# Patient Record
Sex: Male | Born: 1965 | Race: White | Hispanic: No | Marital: Married | Smoking: Never smoker
Health system: Southern US, Community
[De-identification: ages and names within clinical notes are randomized; demographics above are authoritative.]

## PROBLEM LIST (undated history)

## (undated) DIAGNOSIS — M5430 Sciatica, unspecified side: Secondary | ICD-10-CM

## (undated) DIAGNOSIS — H269 Unspecified cataract: Secondary | ICD-10-CM

## (undated) DIAGNOSIS — R0683 Snoring: Secondary | ICD-10-CM

## (undated) DIAGNOSIS — G4730 Sleep apnea, unspecified: Secondary | ICD-10-CM

## (undated) HISTORY — DX: Unspecified cataract: H26.9

---

## 2010-10-14 ENCOUNTER — Ambulatory Visit: Payer: Self-pay | Admitting: Internal Medicine

## 2010-10-26 ENCOUNTER — Ambulatory Visit: Payer: Self-pay | Admitting: Internal Medicine

## 2012-04-20 IMAGING — CR DG RIBS 2V*L*
1 series · 4 of 4 positions shown · non-contrast
Comparison: none

REASON FOR EXAM: left side pain and lump at bottom of ribcage
COMMENTS:

PROCEDURE:     KDR - KDXR RIBS LEFT UNILATERAL  - October 14, 2010  [DATE]
RESULT:     Images of the left ribs demonstrate no bony destruction,
fracture or dislocation.

[Series 1: view not recorded · 0.17mm/px · 4 of 4 slices shown]
[im 1/4]
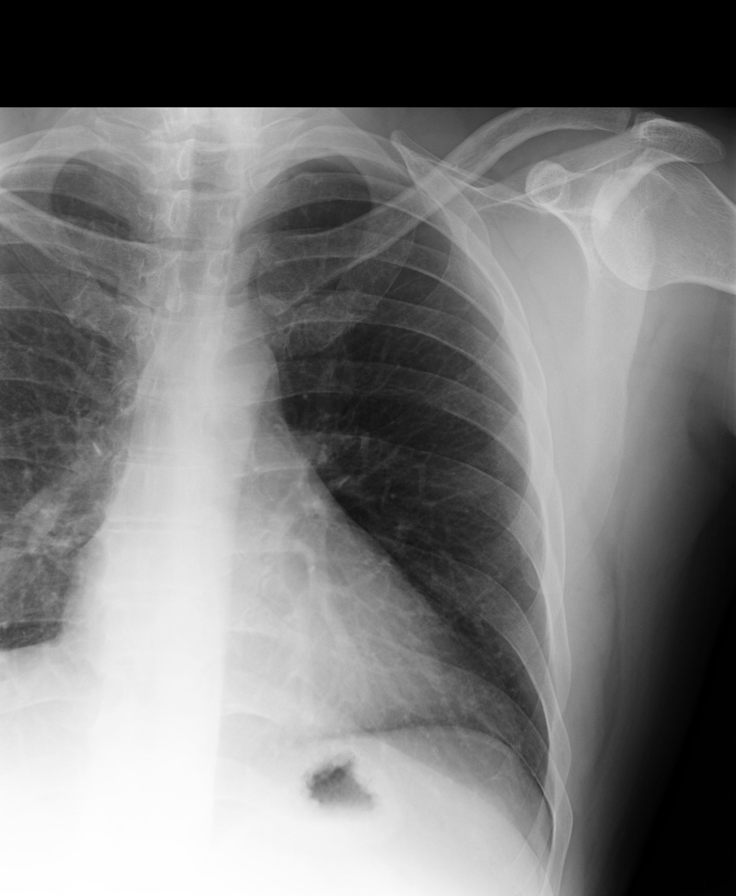
[im 2/4]
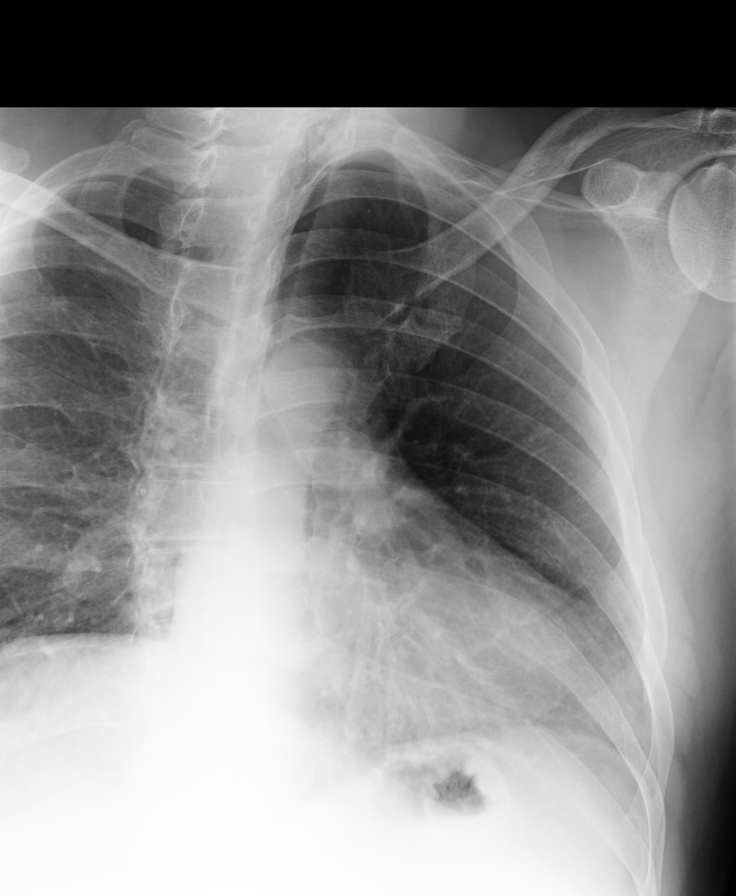
[im 3/4]
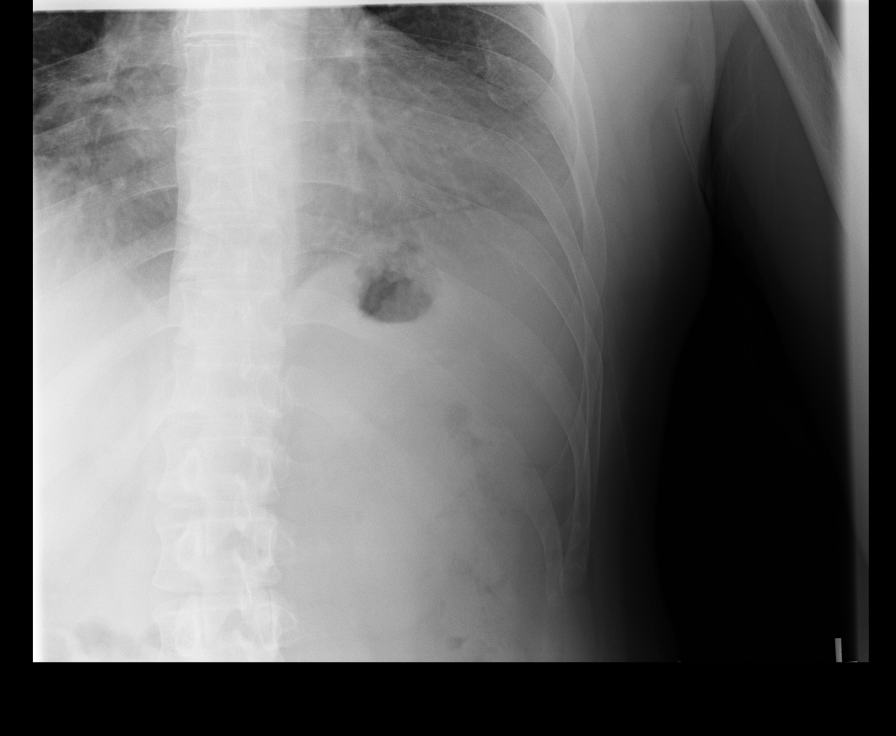
[im 4/4]
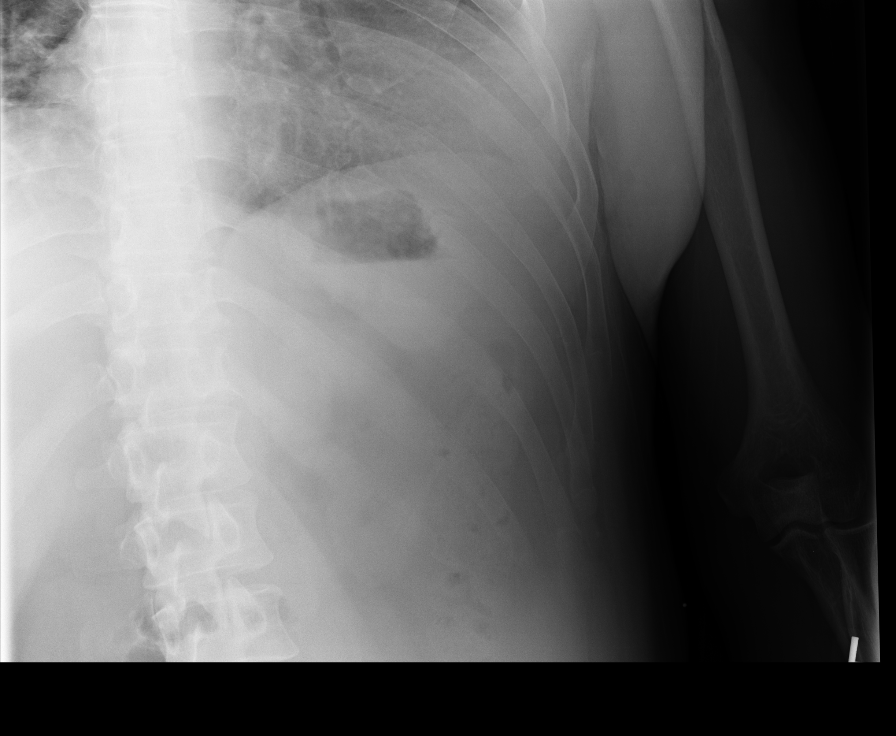

[4 of 4 positions shown; findings below may reference images not displayed]

IMPRESSION: No acute bony abnormality evident.

## 2015-01-05 ENCOUNTER — Encounter: Payer: Self-pay | Admitting: Emergency Medicine

## 2015-01-05 ENCOUNTER — Ambulatory Visit
Admission: EM | Admit: 2015-01-05 | Discharge: 2015-01-05 | Disposition: A | Discharge status: Home / Self Care | Payer: 59 | Attending: Internal Medicine | Admitting: Internal Medicine

## 2015-01-05 DIAGNOSIS — H66001 Acute suppurative otitis media without spontaneous rupture of ear drum, right ear: Secondary | ICD-10-CM

## 2015-01-05 MED ORDER — CEFDINIR 300 MG PO CAPS: 300.0000 mg | ORAL_CAPSULE | Freq: Two times a day (BID) | ORAL | Status: AC

## 2015-01-05 NOTE — ED Provider Notes (Addendum)
CSN: [id number]     Arrival date & time 01/05/15  1100 History   First MD Initiated Contact with Patient 01/05/15 1252     Chief Complaint  Patient presents with  . Otalgia    Right Ear  . Sore Throat   HPI  Started a couple weeks ago with flu sx's; improving but has had increased sinus drainage/congestion for several days and bad R earache starting yesterday.  R sided sore throat. No fever.  No cough.    History reviewed. No pertinent past medical history. History reviewed. No pertinent past surgical history. History reviewed. No pertinent family history. History  Substance Use Topics  . Smoking status: Never Smoker   . Smokeless tobacco: Not on file  . Alcohol Use: No    Review of Systems  All other systems reviewed and are negative.   Allergies  Review of patient's allergies indicates no known allergies.  Home Medications   Prior to Admission medications   Not on File   BP 124/84 mmHg  Pulse 77  Temp(Src) 97.3 F (36.3 C) (Tympanic)  Resp 16  Ht 6\' 1"  (1.854 m)  Wt 210 lb (95.255 kg)  BMI 27.71 kg/m2  SpO2 98% Physical Exam  Constitutional: He is oriented to person, place, and time. He appears well-developed and well-nourished. No distress.  HENT:  Head: Atraumatic.  Right Ear: Tympanic membrane is erythematous and bulging. A middle ear effusion is present.  Left Ear: Tympanic membrane is not erythematous. A middle ear effusion is present.  Nose: Mucosal edema and rhinorrhea present.  Mouth/Throat: Posterior oropharyngeal edema and posterior oropharyngeal erythema present.  Eyes: Lids are normal. Right conjunctiva is not injected. Left conjunctiva is not injected. Right eye exhibits normal extraocular motion. Left eye exhibits normal extraocular motion.  Neck: Neck supple.  Cardiovascular: Normal rate.   Pulmonary/Chest: Effort normal. No respiratory distress.  Abdominal: He exhibits no distension.  Musculoskeletal: Normal range of motion.  Neurological: He  is alert and oriented to person, place, and time.  Skin: Skin is warm and dry.  Nursing note and vitals reviewed.   ED Course  Procedures (including critical care time) Labs Review Labs Reviewed - No data to display  Imaging Review No results found.   MDM   Right otitis media  Rx for cefdinir.  Recheck if not improving in several days.  Sherlene Shams, MD 01/05/15 2030  Sherlene Shams, MD 01/17/15 [phone]

## 2015-01-05 NOTE — ED Notes (Signed)
Patient c/o R earache and sore throat for about 3 weeks.  Patient denies fevers.

## 2015-01-05 NOTE — Discharge Instructions (Signed)

## 2018-01-23 ENCOUNTER — Other Ambulatory Visit: Payer: Self-pay

## 2018-01-23 DIAGNOSIS — Z1211 Encounter for screening for malignant neoplasm of colon: Secondary | ICD-10-CM

## 2018-01-23 MED ORDER — NA SULFATE-K SULFATE-MG SULF 17.5-3.13-1.6 GM/177ML PO SOLN: 1.0000 | Freq: Once | ORAL | 0 refills | Status: AC

## 2018-02-01 NOTE — Discharge Instructions (Signed)
General Anesthesia, Adult, Care After °These instructions provide you with information about caring for yourself after your procedure. Your health care provider may also give you more specific instructions. Your treatment has been planned according to current medical practices, but problems sometimes occur. Call your health care provider if you have any problems or questions after your procedure. °What can I expect after the procedure? °After the procedure, it is common to have: °· Vomiting. °· A sore throat. °· Mental slowness. ° °It is common to feel: °· Nauseous. °· Cold or shivery. °· Sleepy. °· Tired. °· Sore or achy, even in parts of your body where you did not have surgery. ° °Follow these instructions at home: °For at least 24 hours after the procedure: °· Do not: °? Participate in activities where you could fall or become injured. °? Drive. °? Use heavy machinery. °? Drink alcohol. °? Take sleeping pills or medicines that cause drowsiness. °? Make important decisions or sign legal documents. °? Take care of children on your own. °· Rest. °Eating and drinking °· If you vomit, drink water, juice, or soup when you can drink without vomiting. °· Drink enough fluid to keep your urine clear or pale yellow. °· Make sure you have little or no nausea before eating solid foods. °· Follow the diet recommended by your health care provider. °General instructions °· Have a responsible adult stay with you until you are awake and alert. °· Return to your normal activities as told by your health care provider. Ask your health care provider what activities are safe for you. °· Take over-the-counter and prescription medicines only as told by your health care provider. °· If you smoke, do not smoke without supervision. °· Keep all follow-up visits as told by your health care provider. This is important. °Contact a health care provider if: °· You continue to have nausea or vomiting at home, and medicines are not helpful. °· You  cannot drink fluids or start eating again. °· You cannot urinate after 8-12 hours. °· You develop a skin rash. °· You have fever. °· You have increasing redness at the site of your procedure. °Get help right away if: °· You have difficulty breathing. °· You have chest pain. °· You have unexpected bleeding. °· You feel that you are having a life-threatening or urgent problem. °This information is not intended to replace advice given to you by your health care provider. Make sure you discuss any questions you have with your health care provider. °Document Released: 11/29/2000 Document Revised: 01/26/2016 Document Reviewed: 08/07/2015 °Elsevier Interactive Patient Education © 2018 [employer] ° °

## 2018-02-02 ENCOUNTER — Encounter
Admission: RE | Disposition: A | Discharge status: Home / Self Care | Payer: Self-pay | Source: Ambulatory Visit | Attending: Gastroenterology

## 2018-02-02 ENCOUNTER — Ambulatory Visit: Payer: BLUE CROSS/BLUE SHIELD | Admitting: Anesthesiology

## 2018-02-02 ENCOUNTER — Ambulatory Visit
Admission: RE | Admit: 2018-02-02 | Discharge: 2018-02-02 | Disposition: A | Discharge status: Home / Self Care | Payer: BLUE CROSS/BLUE SHIELD | Source: Ambulatory Visit | Attending: Gastroenterology | Admitting: Gastroenterology

## 2018-02-02 DIAGNOSIS — K635 Polyp of colon: Secondary | ICD-10-CM | POA: Diagnosis not present

## 2018-02-02 DIAGNOSIS — K640 First degree hemorrhoids: Secondary | ICD-10-CM | POA: Diagnosis not present

## 2018-02-02 DIAGNOSIS — D123 Benign neoplasm of transverse colon: Secondary | ICD-10-CM

## 2018-02-02 DIAGNOSIS — D125 Benign neoplasm of sigmoid colon: Secondary | ICD-10-CM

## 2018-02-02 DIAGNOSIS — Z1211 Encounter for screening for malignant neoplasm of colon: Secondary | ICD-10-CM | POA: Diagnosis not present

## 2018-02-02 HISTORY — DX: Sciatica, unspecified side: M54.30

## 2018-02-02 HISTORY — PX: COLONOSCOPY WITH PROPOFOL: SHX5780

## 2018-02-02 HISTORY — PX: POLYPECTOMY: SHX5525

## 2018-02-02 HISTORY — DX: Snoring: R06.83

## 2018-02-02 SURGERY — COLONOSCOPY WITH PROPOFOL
Anesthesia: General

## 2018-02-02 MED ORDER — ACETAMINOPHEN 325 MG PO TABS: 650.0000 mg | ORAL_TABLET | Freq: Once | ORAL | Status: DC | PRN

## 2018-02-02 MED ORDER — LIDOCAINE HCL (CARDIAC) PF 100 MG/5ML IV SOSY
PREFILLED_SYRINGE | INTRAVENOUS | Status: DC | PRN
  Administered 2018-02-02: 20 mg via INTRAVENOUS

## 2018-02-02 MED ORDER — ONDANSETRON HCL 4 MG/2ML IJ SOLN: 4.0000 mg | Freq: Once | INTRAMUSCULAR | Status: DC | PRN

## 2018-02-02 MED ORDER — ACETAMINOPHEN 160 MG/5ML PO SOLN: 325.0000 mg | ORAL | Status: DC | PRN

## 2018-02-02 MED ORDER — STERILE WATER FOR IRRIGATION IR SOLN
Status: DC | PRN
  Administered 2018-02-02: 10:00:00

## 2018-02-02 MED ORDER — LACTATED RINGERS IV SOLN
INTRAVENOUS | Status: DC
  Administered 2018-02-02 (×2): via INTRAVENOUS

## 2018-02-02 MED ORDER — PROPOFOL 10 MG/ML IV BOLUS
INTRAVENOUS | Status: DC | PRN
  Administered 2018-02-02: 50 mg via INTRAVENOUS
  Administered 2018-02-02: 80 mg via INTRAVENOUS
  Administered 2018-02-02 (×2): 60 mg via INTRAVENOUS
  Administered 2018-02-02: 20 mg via INTRAVENOUS
  Administered 2018-02-02: 50 mg via INTRAVENOUS
  Administered 2018-02-02: 30 mg via INTRAVENOUS

## 2018-02-02 MED ORDER — SODIUM CHLORIDE 0.9 % IV SOLN: INTRAVENOUS | Status: DC

## 2018-02-02 SURGICAL SUPPLY — 24 items
CANISTER SUCT 1200ML W/VALVE (MISCELLANEOUS) ×2 IMPLANT
CLIP HMST 235XBRD CATH ROT (MISCELLANEOUS) IMPLANT
CLIP RESOLUTION 360 11X235 (MISCELLANEOUS)
ELECT REM PT RETURN 9FT ADLT (ELECTROSURGICAL)
ELECTRODE REM PT RTRN 9FT ADLT (ELECTROSURGICAL) IMPLANT
FCP ESCP3.2XJMB 240X2.8X (MISCELLANEOUS)
FORCEPS BIOP RAD 4 LRG CAP 4 (CUTTING FORCEPS) ×2 IMPLANT
FORCEPS BIOP RJ4 240 W/NDL (MISCELLANEOUS)
FORCEPS ESCP3.2XJMB 240X2.8X (MISCELLANEOUS) IMPLANT
GOWN CVR UNV OPN BCK APRN NK (MISCELLANEOUS) ×2 IMPLANT
GOWN ISOL THUMB LOOP REG UNIV (MISCELLANEOUS) ×2
INJECTOR VARIJECT VIN23 (MISCELLANEOUS) IMPLANT
KIT DEFENDO VALVE AND CONN (KITS) IMPLANT
KIT ENDO PROCEDURE OLY (KITS) ×2 IMPLANT
MARKER SPOT ENDO TATTOO 5ML (MISCELLANEOUS) IMPLANT
PROBE APC STR FIRE (PROBE) IMPLANT
RETRIEVER NET ROTH 2.5X230 LF (MISCELLANEOUS) IMPLANT
SNARE SHORT THROW 13M SML OVAL (MISCELLANEOUS) IMPLANT
SNARE SHORT THROW 30M LRG OVAL (MISCELLANEOUS) IMPLANT
SNARE SNG USE RND 15MM (INSTRUMENTS) IMPLANT
SPOT EX ENDOSCOPIC TATTOO (MISCELLANEOUS)
TRAP ETRAP POLY (MISCELLANEOUS) IMPLANT
VARIJECT INJECTOR VIN23 (MISCELLANEOUS)
WATER STERILE IRR 250ML POUR (IV SOLUTION) ×2 IMPLANT

## 2018-02-02 NOTE — Op Note (Signed)
[facility] Gastroenterology Patient Name: Kevin Santana Procedure Date: 02/02/2018 9:29 AM MRN: [id number] Account #: [id number] Date of Birth: 13-Feb-1966 Admit Type: Outpatient Age: 52 Room: [facility] OR ROOM 01 Gender: Male Note Status: Finalized Procedure:            Colonoscopy Indications:          Screening for colorectal malignant neoplasm Providers:            Lucilla Lame MD, MD Referring MD:         Lynnell Jude (Referring MD) Medicines:            Propofol per Anesthesia Complications:        No immediate complications. Procedure:            Pre-Anesthesia Assessment:                       - Prior to the procedure, a History and Physical was                        performed, and patient medications and allergies were                        reviewed. The patient's tolerance of previous                        anesthesia was also reviewed. The risks and benefits of                        the procedure and the sedation options and risks were                        discussed with the patient. All questions were                        answered, and informed consent was obtained. Prior                        Anticoagulants: The patient has taken no previous                        anticoagulant or antiplatelet agents. ASA Grade                        Assessment: II - A patient with mild systemic disease.                        After reviewing the risks and benefits, the patient was                        deemed in satisfactory condition to undergo the                        procedure.                       After obtaining informed consent, the colonoscope was                        passed under direct vision. Throughout the procedure,  the patient's blood pressure, pulse, and oxygen                        saturations were monitored continuously. The [place] [id number]) was introduced through the               anus and advanced to the the cecum, identified by                        appendiceal orifice and ileocecal valve. The                        colonoscopy was performed without difficulty. The                        patient tolerated the procedure well. The quality of                        the bowel preparation was excellent. Findings:      The perianal and digital rectal examinations were normal.      A 2 mm polyp was found in the sigmoid colon. The polyp was sessile. The       polyp was removed with a cold biopsy forceps. Resection and retrieval       were complete.      A 4 mm polyp was found in the transverse colon. The polyp was sessile.       The polyp was removed with a cold biopsy forceps. Resection and       retrieval were complete.      Non-bleeding internal hemorrhoids were found during retroflexion. The       hemorrhoids were Grade I (internal hemorrhoids that do not prolapse). Impression:           - One 2 mm polyp in the sigmoid colon, removed with a                        cold biopsy forceps. Resected and retrieved.                       - One 4 mm polyp in the transverse colon, removed with                        a cold biopsy forceps. Resected and retrieved.                       - Non-bleeding internal hemorrhoids. Recommendation:       - Discharge patient to home.                       - Resume previous diet.                       - Continue present medications.                       - Await pathology results.                       - Repeat colonoscopy in 5 years if polyp  adenoma and 10                        years if hyperplastic Procedure Code(s):    --- Professional ---                       [phone], Colonoscopy, flexible; with biopsy, single or                        multiple Diagnosis Code(s):    --- Professional ---                       Z12.11, Encounter for screening for malignant neoplasm                        of colon                       D12.5,  Benign neoplasm of sigmoid colon                       D12.3, Benign neoplasm of transverse colon (hepatic                        flexure or splenic flexure) CPT copyright 2017 [employer]. All rights reserved. The codes documented in this report are preliminary and upon coder review may  be revised to meet current compliance requirements. Lucilla Lame MD, MD 02/02/2018 9:59:43 AM This report has been signed electronically. Number of Addenda: 0 Note Initiated On: 02/02/2018 9:29 AM Scope Withdrawal Time: 0 hours 7 minutes 53 seconds  Total Procedure Duration: 0 hours 10 minutes 9 seconds       [facility]

## 2018-02-02 NOTE — H&P (Signed)
   Lucilla Lame, MD [address] [place], [place] [address] Phone: [phone] Fax : [phone]  Primary Care Physician:  Lynnell Jude, MD Primary Gastroenterologist:  Dr. Allen Norris  Pre-Procedure History & Physical: HPI:  Kevin Santana is a 52 y.o. male is here for a screening colonoscopy.   Past Medical History:  Diagnosis Date  . Sciatica   . Snores     History reviewed. No pertinent surgical history.  Prior to Admission medications   Medication Sig Start Date End Date Taking? Authorizing Provider  Flaxseed, Linseed, (FLAX SEED OIL PO) Take by mouth.   Yes [provider]    Allergies as of 01/23/2018  . (No Known Allergies)    History reviewed. No pertinent family history.  Social History   Socioeconomic History  . Marital status: Married    Spouse name: Not on file  . Number of children: Not on file  . Years of education: Not on file  . Highest education level: Not on file  Occupational History  . Not on file  Social Needs  . Financial resource strain: Not on file  . Food insecurity:    Worry: Not on file    Inability: Not on file  . Transportation needs:    Medical: Not on file    Non-medical: Not on file  Tobacco Use  . Smoking status: Never Smoker  . Smokeless tobacco: Never Used  Substance and Sexual Activity  . Alcohol use: No  . Drug use: Not on file  . Sexual activity: Not on file  Lifestyle  . Physical activity:    Days per week: Not on file    Minutes per session: Not on file  . Stress: Not on file  Relationships  . Social connections:    Talks on phone: Not on file    Gets together: Not on file    Attends religious service: Not on file    Active member of club or organization: Not on file    Attends meetings of clubs or organizations: Not on file    Relationship status: Not on file  . Intimate partner violence:    Fear of current or ex partner: Not on file    Emotionally abused: Not on file    Physically  abused: Not on file    Forced sexual activity: Not on file  Other Topics Concern  . Not on file  Social History Narrative  . Not on file    Review of Systems: See HPI, otherwise negative ROS  Physical Exam: BP 130/73   Pulse 73   Resp 16   Ht 6\' 1"  (1.854 m)   Wt 225 lb (102.1 kg)   SpO2 96%   BMI 29.69 kg/m  General:   Alert,  pleasant and cooperative in NAD Head:  Normocephalic and atraumatic. Neck:  Supple; no masses or thyromegaly. Lungs:  Clear throughout to auscultation.    Heart:  Regular rate and rhythm. Abdomen:  Soft, nontender and nondistended. Normal bowel sounds, without guarding, and without rebound.   Neurologic:  Alert and  oriented x4;  grossly normal neurologically.  Impression/Plan: Kevin Santana is now here to undergo a screening colonoscopy.  Risks, benefits, and alternatives regarding colonoscopy have been reviewed with the patient.  Questions have been answered.  All parties agreeable.

## 2018-02-02 NOTE — Anesthesia Postprocedure Evaluation (Signed)
Anesthesia Post Note  Patient: Kevin Santana  Procedure(s) Performed: COLONOSCOPY WITH PROPOFOL (N/A ) POLYPECTOMY (N/A )  Patient location during evaluation: PACU Anesthesia Type: General Level of consciousness: awake and alert, oriented and patient cooperative Pain management: pain level controlled Vital Signs Assessment: post-procedure vital signs reviewed and stable Respiratory status: spontaneous breathing, nonlabored ventilation and respiratory function stable Cardiovascular status: blood pressure returned to baseline and stable Postop Assessment: adequate PO intake Anesthetic complications: no    Darrin Nipper

## 2018-02-02 NOTE — Anesthesia Preprocedure Evaluation (Signed)
Anesthesia Evaluation  Patient identified by MRN, date of birth, ID band Patient awake    Reviewed: Allergy & Precautions, NPO status , Patient's Chart, lab work & pertinent test results  History of Anesthesia Complications Negative for: history of anesthetic complications  Airway Mallampati: III  TM Distance: >3 FB Neck ROM: Full    Dental  (+)    Pulmonary  Snoring    Pulmonary exam normal breath sounds clear to auscultation       Cardiovascular Exercise Tolerance: Good negative cardio ROS Normal cardiovascular exam Rhythm:Regular Rate:Normal     Neuro/Psych negative neurological ROS     GI/Hepatic negative GI ROS,   Endo/Other  negative endocrine ROS  Renal/GU negative Renal ROS     Musculoskeletal   Abdominal   Peds  Hematology negative hematology ROS (+)   Anesthesia Other Findings   Reproductive/Obstetrics                             Anesthesia Physical Anesthesia Plan  ASA: I  Anesthesia Plan: General   Post-op Pain Management:    Induction: Intravenous  PONV Risk Score and Plan: 2 and Propofol infusion and TIVA  Airway Management Planned: Natural Airway  Additional Equipment:   Intra-op Plan:   Post-operative Plan:   Informed Consent: I have reviewed the patients History and Physical, chart, labs and discussed the procedure including the risks, benefits and alternatives for the proposed anesthesia with the patient or authorized representative who has indicated his/her understanding and acceptance.     Plan Discussed with: CRNA  Anesthesia Plan Comments:         Anesthesia Quick Evaluation

## 2018-02-02 NOTE — Transfer of Care (Signed)
Immediate Anesthesia Transfer of Care Note  Patient: Kevin Santana  Procedure(s) Performed: COLONOSCOPY WITH PROPOFOL (N/A )  Patient Location: PACU  Anesthesia Type: General  Level of Consciousness: awake, alert  and patient cooperative  Airway and Oxygen Therapy: Patient Spontanous Breathing and Patient connected to supplemental oxygen  Post-op Assessment: Post-op Vital signs reviewed, Patient's Cardiovascular Status Stable, Respiratory Function Stable, Patent Airway and No signs of Nausea or vomiting  Post-op Vital Signs: Reviewed and stable  Complications: No apparent anesthesia complications

## 2018-02-02 NOTE — Anesthesia Procedure Notes (Signed)
Procedure Name: MAC Date/Time: 02/02/2018 9:50 AM Performed by: Lind Guest, CRNA Pre-anesthesia Checklist: Patient identified, Emergency Drugs available, Suction available, Patient being monitored and Timeout performed Patient Re-evaluated:Patient Re-evaluated prior to induction Oxygen Delivery Method: Nasal cannula

## 2018-02-03 ENCOUNTER — Encounter: Payer: Self-pay | Admitting: Gastroenterology

## 2018-02-06 ENCOUNTER — Encounter: Payer: Self-pay | Admitting: Gastroenterology

## 2019-10-26 NOTE — Progress Notes (Signed)
[facility] Note  10/30/2019     CHIEF COMPLAINT Patient presents for Retina Evaluation   HISTORY OF PRESENT ILLNESS: Kevin Santana is a 54 y.o. male who presents to the clinic today for:   HPI    Retina Evaluation    In left eye.  Onset: unknown.  Duration: unknown.  Associated Symptoms Negative for Flashes, Distortion, Pain, Photophobia, Trauma, Jaw Claudication, Fever, Fatigue, Floaters, Blind Spot, Redness, Glare, Scalp Tenderness, Shoulder/Hip pain and Weight Loss.  Context:  reading.  Treatments tried include no treatments.  I, the attending physician,  performed the HPI with the patient and updated documentation appropriately.          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 Only vision complaint is blurry at times for reading. Uses readers for up-close, no correction for distance. Patient has amblyopia OD.        Last edited by Bernarda Caffey, MD on 10/30/2019  8:43 AM. (History)    pt is here on the referral of Dr. Rick Duff for possible nevus OS, he states that was the first visit with [facility], pt has not previously been told that he has a nevus, pt denies being diabetic of hypertensive, pt states his vision started decreasing about 10 years ago, but he has only been wearing readers since then, pt states his right eye is amblyopic, and he wore a patch when he was 5, pt states his dad has a hx of abdominal cancer  Referring physician: Thelma Comp, [place],  [place] [id number]  HISTORICAL INFORMATION:   Selected notes from the [facility] Referred by Dr. Rick Duff for ret eval   CURRENT MEDICATIONS: No current outpatient medications on file. (Ophthalmic Drugs)   No current facility-administered medications for this visit. (Ophthalmic Drugs)   Current Outpatient Medications (Other)  Medication Sig  . co-enzyme Q-10 50 MG capsule Take 50 mg by mouth daily.  . Flaxseed, Linseed, (FLAX SEED OIL  PO) Take by mouth.   No current facility-administered medications for this visit. (Other)      REVIEW OF SYSTEMS: ROS    Positive for: Eyes   Negative for: Constitutional, Gastrointestinal, Neurological, Skin, Genitourinary, Musculoskeletal, HENT, Endocrine, Cardiovascular, Respiratory, Psychiatric, Allergic/Imm, Heme/Lymph   Last edited by Jobe Marker, COT on 10/30/2019  8:15 AM. (History)       ALLERGIES No Known Allergies  PAST MEDICAL HISTORY Past Medical History:  Diagnosis Date  . Sciatica   . Snores    Past Surgical History:  Procedure Laterality Date  . COLONOSCOPY WITH PROPOFOL N/A 02/02/2018   Procedure: COLONOSCOPY WITH PROPOFOL;  Surgeon: Lucilla Lame, MD;  Location: [facility];  Service: Endoscopy;  Laterality: N/A;  . POLYPECTOMY N/A 02/02/2018   Procedure: POLYPECTOMY;  Surgeon: Lucilla Lame, MD;  Location: [facility];  Service: Endoscopy;  Laterality: N/A;    FAMILY HISTORY History reviewed. No pertinent family history.  SOCIAL HISTORY Social History   Tobacco Use  . Smoking status: Never Smoker  . Smokeless tobacco: Never Used  Substance Use Topics  . Alcohol use: No  . Drug use: Not on file         OPHTHALMIC EXAM:  Base Eye Exam    Visual Acuity (Snellen - Linear)      Right Left   Dist [place] 20/70 -3 20/20 -2   Dist ph cc NI 20/20       Tonometry (Tonopen, 8:26 AM)  Right Left   Pressure 21 24       Pupils      Dark Light Shape React APD   Right 4 3 Round Slow None   Left 4 3 Round Slow None       Visual Fields (Counting fingers)      Left Right    Full Full       Extraocular Movement      Right Left    Full, Ortho Full, Ortho       Dilation    Both eyes: 1.0% Mydriacyl, 2.5% Phenylephrine @ 8:26 AM        Slit Lamp and Fundus Exam    Slit Lamp Exam      Right Left   Lids/Lashes Meibomian gland dysfunction, Scurf Meibomian gland dysfunction, Scurf   Conjunctiva/Sclera White and quiet White  and quiet   Cornea Trace Punctate epithelial erosions, +EBMD Trace Punctate epithelial erosions, +EBMD   Anterior Chamber Deep and quiet Deep and quiet   Iris Round and dilated Round and dilated   Lens 1+ Nuclear sclerosis, 1-2+ Cortical cataract 2+ Nuclear sclerosis, 2+ Cortical cataract   Vitreous Mild Vitreous syneresis Mild Vitreous syneresis       Fundus Exam      Right Left   Disc Pink and Sharp Pink and Sharp   C/D Ratio 0.0 0.1   Macula Flat, Good foveal reflex, No heme or edema Flat, Good foveal reflex, No heme or edema   Vessels Mild Vascular attenuation, mild Tortuousity Mild Vascular attenuation, mild Tortuousity   Periphery Attached, no heme    Attached, pigmented choroidal mass spanning 0600-0700 equator, approx 3-4DD in diameter with +drusen, no SRF or orange pigment, ~40m in thickness          IMAGING AND PROCEDURES  Imaging and Procedures for @TODAY @  OCT, Retina - OU - Both Eyes       Right Eye Quality was good. Central Foveal Thickness: 349. Progression has no prior data. Findings include normal foveal contour, no IRF, no SRF.   Left Eye Quality was good. Central Foveal Thickness: 345. Progression has no prior data. Findings include normal foveal contour, no IRF, no SRF (Elevated choroidal lesion inferior equator w/ mild surrounding schisis; no SRF -- caught on widefield).   Notes *Images captured and stored on drive  Diagnosis / Impression:  NFP, no IRF/SRF OU Elevated choroidal lesion inferior equator w/ mild surrounding schisis; no SRF -- caught on widefield  Clinical management:  See below  Abbreviations: NFP - Normal foveal profile. CME - cystoid macular edema. PED - pigment epithelial detachment. IRF - intraretinal fluid. SRF - subretinal fluid. EZ - ellipsoid zone. ERM - epiretinal membrane. ORA - outer retinal atrophy. ORT - outer retinal tubulation. SRHM - subretinal hyper-reflective material        B-Scan Ultrasound - OS - Left Eye        Quality was good. Findings included mass.   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 Retinal edema  H35.81 OCT, Retina - OU - Both Eyes  3. Combined forms of age-related cataract of both eyes  H25.813    1,2. Choroidal Nevus OS  - pigmented choroidal mass  spanning 6-7 oclock equator, no SRF or orange pigment  - OCT shows thickness ~[phone] microns; no SRF, but there is surrounding retinoschisis  - b-scan shows mild elevation of inferior choroidal lesion  - baseline Optos pictures obtained today (02.23.21)  - discussed findings, prognosis and possible treatments  - will refer to [facility] (Dr. Blanchie Serve) for further evaluation  - f/u here in 3-4 months -- okay to cancel if Dr. Cordelia Pen wishes to follow  3. Mixed form age related cataracts OU  - The symptoms of cataract, surgical options, and treatments and risks were discussed with patient.  - discussed diagnosis and progression  - not yet visually significant  - monitor for now   Ophthalmic Meds Ordered this visit:  No orders of the defined types were placed in this encounter.      Return for f/u 3-4 months, nevus OS, DFE, OCT, OPTOS.  There are no Patient Instructions on file for this visit.   Explained the diagnoses, plan, and follow up with the patient and they expressed understanding.  Patient expressed understanding of the importance of proper follow up care.   This document serves as a record of services personally performed by Gardiner Sleeper, MD, PhD. It was created on their behalf by Estill Bakes, COT an ophthalmic technician. The creation of this record is the provider's dictation and/or activities during the visit.    Electronically signed by: Estill Bakes, COT 10/26/19 @ 1:09 PM   This document  serves as a record of services personally performed by Gardiner Sleeper, MD, PhD. It was created on their behalf by Ernest Mallick, OA, an ophthalmic assistant. The creation of this record is the provider's dictation and/or activities during the visit.    Electronically signed by: Ernest Mallick, OA 02.23.2021 1:09 PM  Gardiner Sleeper, M.D., Ph.D. Diseases & Surgery of the Retina and [place] 10/30/2019   I have reviewed the above documentation for accuracy and completeness, and I agree with the above. Gardiner Sleeper, M.D., Ph.D. 10/30/19 1:09 PM   Abbreviations: M myopia (nearsighted); A astigmatism; H hyperopia (farsighted); P presbyopia; Mrx spectacle prescription;  CTL contact lenses; OD right eye; OS left eye; OU both eyes  XT exotropia; ET esotropia; PEK punctate epithelial keratitis; PEE punctate epithelial erosions; DES dry eye syndrome; MGD meibomian gland dysfunction; ATs artificial tears; PFAT's preservative free artificial tears; [place] nuclear sclerotic cataract; PSC posterior subcapsular cataract; ERM epi-retinal membrane; PVD posterior vitreous detachment; RD retinal detachment; DM diabetes mellitus; DR diabetic retinopathy; NPDR non-proliferative diabetic retinopathy; PDR proliferative diabetic retinopathy; CSME clinically significant macular edema; DME diabetic macular edema; dbh dot blot hemorrhages; CWS cotton wool spot; POAG primary open angle glaucoma; C/D cup-to-disc ratio; HVF humphrey visual field; GVF goldmann visual field; OCT optical coherence tomography; IOP intraocular pressure; BRVO Branch retinal vein occlusion; CRVO central retinal vein occlusion; CRAO central retinal artery occlusion; BRAO branch retinal artery occlusion; RT retinal tear; SB scleral buckle; PPV pars plana vitrectomy; VH Vitreous hemorrhage; PRP panretinal laser photocoagulation; IVK intravitreal kenalog; VMT vitreomacular traction; MH Macular hole;  NVD neovascularization of  the disc; NVE neovascularization elsewhere; AREDS age related eye disease study; ARMD age related macular degeneration; POAG primary open angle glaucoma; EBMD epithelial/anterior basement membrane dystrophy; ACIOL anterior chamber intraocular lens; IOL intraocular lens; PCIOL posterior chamber intraocular lens; Phaco/IOL phacoemulsification with intraocular lens placement; [place] photorefractive keratectomy; LASIK laser assisted in situ keratomileusis; HTN hypertension; DM diabetes mellitus; COPD chronic obstructive pulmonary disease

## 2019-10-30 ENCOUNTER — Ambulatory Visit (INDEPENDENT_AMBULATORY_CARE_PROVIDER_SITE_OTHER): Payer: BC Managed Care – PPO | Admitting: Ophthalmology

## 2019-10-30 ENCOUNTER — Encounter (INDEPENDENT_AMBULATORY_CARE_PROVIDER_SITE_OTHER): Payer: Self-pay | Admitting: Ophthalmology

## 2019-10-30 DIAGNOSIS — H3581 Retinal edema: Secondary | ICD-10-CM | POA: Diagnosis not present

## 2019-10-30 DIAGNOSIS — H25813 Combined forms of age-related cataract, bilateral: Secondary | ICD-10-CM | POA: Diagnosis not present

## 2019-10-30 DIAGNOSIS — D3132 Benign neoplasm of left choroid: Secondary | ICD-10-CM

## 2019-11-08 DIAGNOSIS — D3132 Benign neoplasm of left choroid: Secondary | ICD-10-CM | POA: Insufficient documentation

## 2019-11-08 DIAGNOSIS — H2513 Age-related nuclear cataract, bilateral: Secondary | ICD-10-CM | POA: Insufficient documentation

## 2019-11-09 DIAGNOSIS — H53001 Unspecified amblyopia, right eye: Secondary | ICD-10-CM | POA: Insufficient documentation

## 2020-02-21 NOTE — Progress Notes (Signed)
[facility] Note  02/27/2020     CHIEF COMPLAINT Patient presents for Retina Follow Up   HISTORY OF PRESENT ILLNESS: Kevin Santana is a 54 y.o. male who presents to the clinic today for:   HPI    Retina Follow Up    Patient presents with  Other.  In left eye.  This started 4 months ago.  Severity is moderate.  I, the attending physician,  performed the HPI with the patient and updated documentation appropriately.          Comments    Patient here for 4 months retina follow up for Chorodial nevus OS. Patient states vision about the same. No eye pain. 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 Added eye supplement called visiclear.        Last edited by Bernarda Caffey, MD on 02/27/2020  8:47 AM. (History)    pt saw Dr. Cordelia Pen on 03.05.21 who recommended annual follow up here, he was told to return to Dr. Cordelia Pen if he had any significant changes, Dr. Cordelia Pen agreed with Dr. Dairl Ponder dx, pt was in the woods and got hit in the eye with a branch, no vision changes  Referring physician: Lynnell Jude, MD [place],  [place] [phone]  HISTORICAL INFORMATION:   Selected notes from the MEDICAL RECORD NUMBER Referred by Dr. Rick Duff for ret eval   CURRENT MEDICATIONS: No current outpatient medications on file. (Ophthalmic Drugs)   No current facility-administered medications for this visit. (Ophthalmic Drugs)   Current Outpatient Medications (Other)  Medication Sig  . co-enzyme Q-10 50 MG capsule Take 50 mg by mouth daily.  . Flaxseed, Linseed, (FLAX SEED OIL PO) Take by mouth.   No current facility-administered medications for this visit. (Other)      REVIEW OF SYSTEMS: ROS    Positive for: Eyes   Negative for: Constitutional, Gastrointestinal, Neurological, Skin, Genitourinary, Musculoskeletal, HENT, Endocrine, Cardiovascular, Respiratory, Psychiatric, Allergic/Imm, Heme/Lymph   Last edited by Theodore Demark, COA on 02/27/2020   8:16 AM. (History)       ALLERGIES No Known Allergies  PAST MEDICAL HISTORY Past Medical History:  Diagnosis Date  . Sciatica   . Snores    Past Surgical History:  Procedure Laterality Date  . COLONOSCOPY WITH PROPOFOL N/A 02/02/2018   Procedure: COLONOSCOPY WITH PROPOFOL;  Surgeon: Lucilla Lame, MD;  Location: [facility];  Service: Endoscopy;  Laterality: N/A;  . POLYPECTOMY N/A 02/02/2018   Procedure: POLYPECTOMY;  Surgeon: Lucilla Lame, MD;  Location: [facility];  Service: Endoscopy;  Laterality: N/A;    FAMILY HISTORY History reviewed. No pertinent family history.  SOCIAL HISTORY Social History   Tobacco Use  . Smoking status: Never Smoker  . Smokeless tobacco: Never Used  Substance Use Topics  . Alcohol use: No  . Drug use: Not on file         OPHTHALMIC EXAM:  Base Eye Exam    Visual Acuity (Snellen - Linear)      Right Left   Dist [place] 20/80 -1 20/25 +1   Dist ph [place] 20/80 +2 20/20 -1       Tonometry (Tonopen, 8:13 AM)      Right Left   Pressure 18 20       Pupils      Dark Light Shape React APD   Right 4 3 Round Slow None   Left 4 3 Round Slow None  Visual Fields (Counting fingers)      Left Right    Full Full       Extraocular Movement      Right Left    Full, Ortho Full, Ortho       Neuro/Psych    Oriented x3: Yes   Mood/Affect: Normal       Dilation    Both eyes: 1.0% Mydriacyl, 2.5% Phenylephrine @ 8:13 AM        Slit Lamp and Fundus Exam    Slit Lamp Exam      Right Left   Lids/Lashes Meibomian gland dysfunction, Scurf Meibomian gland dysfunction, Scurf   Conjunctiva/Sclera Subconjunctival hemorrhage nasally and inferiorly White and quiet   Cornea Trace Punctate epithelial erosions, +EBMD Trace Punctate epithelial erosions, +EBMD   Anterior Chamber Deep and quiet Deep and quiet   Iris Round and dilated Round and dilated   Lens 1+ Nuclear sclerosis, 1-2+ Cortical cataract 2+ Nuclear sclerosis, 2+  Cortical cataract   Vitreous Mild Vitreous syneresis Mild Vitreous syneresis       Fundus Exam      Right Left   Disc Pink and Sharp, Compact Pink and Sharp, Compact   C/D Ratio 0.0 0.0   Macula Flat, Good foveal reflex, No heme or edema Flat, Good foveal reflex, mild RPE mottling, No heme or edema   Vessels Mild Vascular attenuation, mild Tortuousity Mild Vascular attenuation, mild Tortuousity   Periphery Attached, no heme    Attached, pigmented choroidal mass spanning 0600-0700 equator, approx 3-4DD in diameter (87m) with +drusen, no SRF or orange pigment, ~153min thickness          IMAGING AND PROCEDURES  Imaging and Procedures for @TODAY @  OCT, Retina - OU - Both Eyes       Right Eye Quality was good. Central Foveal Thickness: 349. Progression has been stable. Findings include normal foveal contour, no IRF, no SRF, vitreomacular adhesion  (Single druse).   Left Eye Quality was good. Central Foveal Thickness: 350. Progression has been stable. Findings include normal foveal contour, no IRF, no SRF (Elevated choroidal lesion inferior equator w/ mild surrounding schisis; no SRF -- caught on widefield - stable from prior).   Notes *Images captured and stored on drive  Diagnosis / Impression:  NFP, no IRF/SRF OU OS: Elevated choroidal lesion inferior equator w/ mild surrounding schisis; no SRF -- caught on widefield  Clinical management:  See below  Abbreviations: NFP - Normal foveal profile. CME - cystoid macular edema. PED - pigment epithelial detachment. IRF - intraretinal fluid. SRF - subretinal fluid. EZ - ellipsoid zone. ERM - epiretinal membrane. ORA - outer retinal atrophy. ORT - outer retinal tubulation. 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 Retinal edema  H35.81 OCT, Retina - OU - Both Eyes  3. Combined forms of age-related cataract of both eyes  H25.813     1,2. Choroidal Nevus OS  - pigmented choroidal mass spanning 6-7 oclock equator, no SRF or orange pigment  - OCT shows thickness ~[phone] microns; no SRF, but there is surrounding retinoschisis  - b-scan shows mild elevation of inferior choroidal lesion  - baseline Optos pictures obtained 02.23.21  - saw Dr. C.Blanchie Serven 3.5.21 who agreed with diagnosis and recommended annual f/u here, [facility]  -  f/u here in 1 year, DFE, OCT, optos colors  3. Mixed form age related cataracts OU  - The symptoms of cataract, surgical options, and treatments and risks were discussed with patient.  - discussed diagnosis and progression  - not yet visually significant  - monitor for now   Ophthalmic Meds Ordered this visit:  No orders of the defined types were placed in this encounter.      Return in about 1 year (around 02/26/2021) for f/u nevus OS, DFE, OCT, OPTOS.  There are no Patient Instructions on file for this visit.   Explained the diagnoses, plan, and follow up with the patient and they expressed understanding.  Patient expressed understanding of the importance of proper follow up care.   This document serves as a record of services personally performed by Gardiner Sleeper, MD, PhD. It was created on their behalf by [place] Jetty. Owens Shark, COT, an ophthalmic technician. The creation of this record is the provider's dictation and/or activities during the visit.    Electronically signed by: San Jetty. Owens Shark, [place] 06.23.2021 8:49 AM  Gardiner Sleeper, M.D., Ph.D. Diseases & Surgery of the Retina and [place] 02/27/2020   I have reviewed the above documentation for accuracy and completeness, and I agree with the above. Gardiner Sleeper, M.D., Ph.D. 02/27/20 8:49 AM    Abbreviations: M myopia (nearsighted); A astigmatism; H hyperopia (farsighted); P presbyopia; Mrx spectacle prescription;  CTL contact lenses; OD right eye; OS left eye; OU  both eyes  XT exotropia; ET esotropia; PEK punctate epithelial keratitis; PEE punctate epithelial erosions; DES dry eye syndrome; MGD meibomian gland dysfunction; ATs artificial tears; PFAT's preservative free artificial tears; [place] nuclear sclerotic cataract; PSC posterior subcapsular cataract; ERM epi-retinal membrane; PVD posterior vitreous detachment; RD retinal detachment; DM diabetes mellitus; DR diabetic retinopathy; NPDR non-proliferative diabetic retinopathy; PDR proliferative diabetic retinopathy; CSME clinically significant macular edema; DME diabetic macular edema; dbh dot blot hemorrhages; CWS cotton wool spot; POAG primary open angle glaucoma; C/D cup-to-disc ratio; HVF humphrey visual field; GVF goldmann visual field; OCT optical coherence tomography; IOP intraocular pressure; BRVO Branch retinal vein occlusion; CRVO central retinal vein occlusion; CRAO central retinal artery occlusion; BRAO branch retinal artery occlusion; RT retinal tear; SB scleral buckle; PPV pars plana vitrectomy; VH Vitreous hemorrhage; PRP panretinal laser photocoagulation; IVK intravitreal kenalog; VMT vitreomacular traction; MH Macular hole;  NVD neovascularization of the disc; NVE neovascularization elsewhere; AREDS age related eye disease study; ARMD age related macular degeneration; POAG primary open angle glaucoma; EBMD epithelial/anterior basement membrane dystrophy; ACIOL anterior chamber intraocular lens; IOL intraocular lens; PCIOL posterior chamber intraocular lens; Phaco/IOL phacoemulsification with intraocular lens placement; [place] photorefractive keratectomy; LASIK laser assisted in situ keratomileusis; HTN hypertension; DM diabetes mellitus; COPD chronic obstructive pulmonary disease

## 2020-02-27 ENCOUNTER — Other Ambulatory Visit: Payer: Self-pay

## 2020-02-27 ENCOUNTER — Encounter (INDEPENDENT_AMBULATORY_CARE_PROVIDER_SITE_OTHER): Payer: Self-pay | Admitting: Ophthalmology

## 2020-02-27 ENCOUNTER — Ambulatory Visit (INDEPENDENT_AMBULATORY_CARE_PROVIDER_SITE_OTHER): Payer: BC Managed Care – PPO | Admitting: Ophthalmology

## 2020-02-27 DIAGNOSIS — H3581 Retinal edema: Secondary | ICD-10-CM | POA: Diagnosis not present

## 2020-02-27 DIAGNOSIS — H25813 Combined forms of age-related cataract, bilateral: Secondary | ICD-10-CM | POA: Diagnosis not present

## 2020-02-27 DIAGNOSIS — D3132 Benign neoplasm of left choroid: Secondary | ICD-10-CM | POA: Diagnosis not present

## 2021-02-24 NOTE — Progress Notes (Signed)
[facility] Note  02/26/2021     CHIEF COMPLAINT Patient presents for Retina Follow Up   HISTORY OF PRESENT ILLNESS: Kevin Santana is a 55 y.o. male who presents to the clinic today for:   HPI     Retina Follow Up   Patient presents with  Other.  In left eye.  This started 16 months ago.  Severity is mild.  Duration of 1 year.  Since onset it is stable.  I, the attending physician,  performed the HPI with the patient and updated documentation appropriately.        Comments   55 y/o male pt here for 1 yr f/u for chor nevus OS.  No change in [place] OU.  Denies pain, FOL, floaters.  No gtts.      Last edited by Kevin Caffey, MD on 02/26/2021  8:56 AM.     pt states vision is stable  Referring physician: Thelma Comp, [address],  [place] [id number]  HISTORICAL INFORMATION:   Selected notes from the MEDICAL RECORD NUMBER Referred by Dr. Rick Duff for ret eval   CURRENT MEDICATIONS: No current outpatient medications on file. (Ophthalmic Drugs)   No current facility-administered medications for this visit. (Ophthalmic Drugs)   Current Outpatient Medications (Other)  Medication Sig   co-enzyme Q-10 50 MG capsule Take 50 mg by mouth daily.   Flaxseed, Linseed, (FLAX SEED OIL PO) Take by mouth.   No current facility-administered medications for this visit. (Other)      REVIEW OF SYSTEMS: ROS   Positive for: Eyes Negative for: Constitutional, Gastrointestinal, Neurological, Skin, Genitourinary, Musculoskeletal, HENT, Endocrine, Cardiovascular, Respiratory, Psychiatric, Allergic/Imm, Heme/Lymph Last edited by Kevin Santana, COA on 02/26/2021  8:10 AM.        ALLERGIES No Known Allergies  PAST MEDICAL HISTORY Past Medical History:  Diagnosis Date   Cataract    Sciatica    Snores    Past Surgical History:  Procedure Laterality Date   COLONOSCOPY WITH PROPOFOL N/A 02/02/2018   Procedure: COLONOSCOPY WITH  PROPOFOL;  Surgeon: Lucilla Lame, MD;  Location: [place] [facility];  Service: Endoscopy;  Laterality: N/A;   POLYPECTOMY N/A 02/02/2018   Procedure: POLYPECTOMY;  Surgeon: Lucilla Lame, MD;  Location: [facility];  Service: Endoscopy;  Laterality: N/A;    FAMILY HISTORY History reviewed. No pertinent family history.  SOCIAL HISTORY Social History   Tobacco Use   Smoking status: Never   Smokeless tobacco: Never  Substance Use Topics   Alcohol use: No         OPHTHALMIC EXAM:  Base Eye Exam     Visual Acuity (Snellen - Linear)       Right Left   Dist [place] 20/80 20/20 -2   Dist ph [place] 20/70 +2          Tonometry (Tonopen, 8:11 AM)       Right Left   Pressure 22 18         Pupils       Dark Light Shape React APD   Right 4 3 Round Slow None   Left 4 3 Round Slow None         Visual Fields (Counting fingers)       Left Right    Full Full         Extraocular Movement       Right Left    Full, Ortho Full, Ortho  Neuro/Psych     Oriented x3: Yes   Mood/Affect: Normal         Dilation     Both eyes: 1.0% Mydriacyl, 2.5% Phenylephrine @ 8:11 AM           Slit Lamp and Fundus Exam     Slit Lamp Exam       Right Left   Lids/Lashes Meibomian gland dysfunction, Scurf Meibomian gland dysfunction, Scurf   Conjunctiva/Sclera Subconjunctival hemorrhage nasally and inferiorly White and quiet   Cornea tear film debris tear film debris   Anterior Chamber Deep and quiet Deep and quiet   Iris Round and dilated Round and dilated   Lens 1+ Nuclear sclerosis, 1-2+ Cortical cataract 2+ Nuclear sclerosis, 2+ Cortical cataract   Vitreous Mild Vitreous syneresis Mild Vitreous syneresis         Fundus Exam       Right Left   Disc mild pallor, Sharp, Compact Pink and Sharp, Compact   C/D Ratio 0.0 0.0   Macula Flat, Good foveal reflex, RPE mottling and clumping, No heme or edema Flat, Good foveal reflex, mild RPE mottling, No heme  or edema   Vessels attenuated, mild tortuousity, mild AV crossing changes attenuated, mild tortuousity, mild AV crossing changes   Periphery Attached, no heme    Attached, pigmented choroidal mass spanning 0600-0700 equator, approx 3-4DD in diameter (35mm) with +drusen, no SRF or orange pigment, ~64mm in thickness -- no significant change from prior, mild White without pressure temporally            IMAGING AND PROCEDURES  Imaging and Procedures for $RemoveBefore'@TODAY'cNHThkVxsSqSx$ @  OCT, Retina - OU - Both Eyes       Right Eye Quality was good. Central Foveal Thickness: 350. Progression has been stable. Findings include normal foveal contour, no IRF, no SRF, vitreomacular adhesion (Single druse).   Left Eye Quality was good. Central Foveal Thickness: 350. Progression has been stable. Findings include normal foveal contour, no IRF, no SRF, vitreomacular adhesion (Elevated choroidal lesion inferior equator w/ overlying drusen and surrounding schisis; no SRF -- caught on widefield - stable from prior).   Notes *Images captured and stored on drive  Diagnosis / Impression:  NFP, no IRF/SRF OU OS: Elevated choroidal lesion inferior equator w/ drusen and surrounding schisis; no SRF -- caught on widefield -- stable from prior  Clinical management:  See below  Abbreviations: NFP - Normal foveal profile. CME - cystoid macular edema. PED - pigment epithelial detachment. IRF - intraretinal fluid. SRF - subretinal fluid. EZ - ellipsoid zone. ERM - epiretinal membrane. ORA - outer retinal atrophy. ORT - outer retinal tubulation. SRHM - subretinal hyper-reflective material      Color Fundus Photography Optos - OU - Both Eyes       Right Eye Progression has been stable. Disc findings include normal observations. Macula : normal observations. Vessels : normal observations. Periphery : normal observations.   Left Eye Progression has been stable. Disc findings include normal observations. Macula : normal observations.  Vessels : normal observations. Periphery : mass, RPE abnormality.   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 Retinal edema  H35.81 OCT,  Retina - OU - Both Eyes    3. Combined forms of age-related cataract of both eyes  H25.813     4. Amblyopia of right eye  H53.001       1,2. Choroidal Nevus OS  - pigmented choroidal mass spanning 6-7 oclock equator, no SRF or orange pigment  - OCT shows thickness ~[id number] microns; no SRF, but there is surrounding retinoschisis -- no change from prior  - b-scan shows mild elevation of inferior choroidal lesion  - baseline Optos pictures obtained 02.23.21  - saw Dr. Blanchie Serve on 3.5.21 who agreed with diagnosis and recommended annual f/u here, return to [facility] if any changes  - f/u here in 1 year, DFE, OCT, optos colors   3. Mixed form age related cataracts OU  - The symptoms of cataract, surgical options, and treatments and risks were discussed with patient.  - discussed diagnosis and progression  - not yet visually significant  - monitor for now   4. Amblyopia OD   Ophthalmic Meds Ordered this visit:  No orders of the defined types were placed in this encounter.      Return in about 1 year (around 02/26/2022) for f/u choroidal nevus OS, DFE, OCT.  There are no Patient Instructions on file for this visit.   Explained the diagnoses, plan, and follow up with the patient and they expressed understanding.  Patient expressed understanding of the importance of proper follow up care.   This document serves as a record of services personally performed by Gardiner Sleeper, MD, PhD. It was created on their behalf by Leonie Douglas, an ophthalmic technician. The creation of this record is the provider's dictation  and/or activities during the visit.    Electronically signed by: Leonie Douglas COA, 02/28/21  12:30 AM  This document serves as a record of services personally performed by Gardiner Sleeper, MD, PhD. It was created on their behalf by San Jetty. Owens Shark, OA an ophthalmic technician. The creation of this record is the provider's dictation and/or activities during the visit.    Electronically signed by: San Jetty. Marguerita Merles 06.23.2022 12:30 AM  Gardiner Sleeper, M.D., Ph.D. Diseases & Surgery of the Retina and [place] 02/26/2021   I have reviewed the above documentation for accuracy and completeness, and I agree with the above. Gardiner Sleeper, M.D., Ph.D. 02/28/21 12:30 AM   Abbreviations: M myopia (nearsighted); A astigmatism; H hyperopia (farsighted); P presbyopia; Mrx spectacle prescription;  CTL contact lenses; OD right eye; OS left eye; OU both eyes  XT exotropia; ET esotropia; PEK punctate epithelial keratitis; PEE punctate epithelial erosions; DES dry eye syndrome; MGD meibomian gland dysfunction; ATs artificial tears; PFAT's preservative free artificial tears; [place] nuclear sclerotic cataract; PSC posterior subcapsular cataract; ERM epi-retinal membrane; PVD posterior vitreous detachment; RD retinal detachment; DM diabetes mellitus; DR diabetic retinopathy; NPDR non-proliferative diabetic retinopathy; PDR proliferative diabetic retinopathy; CSME clinically significant macular edema; DME diabetic macular edema; dbh dot blot hemorrhages; CWS cotton wool spot; POAG primary open angle glaucoma; C/D cup-to-disc ratio; HVF humphrey visual field; GVF goldmann visual field; OCT optical coherence tomography; IOP intraocular pressure; BRVO Branch retinal vein occlusion; CRVO central retinal vein occlusion; CRAO central retinal artery occlusion; BRAO branch retinal artery occlusion; RT retinal tear; SB scleral buckle; PPV pars plana vitrectomy; VH Vitreous hemorrhage; PRP  panretinal laser photocoagulation; IVK intravitreal kenalog; VMT vitreomacular traction; MH Macular hole;  NVD neovascularization of the disc; NVE neovascularization elsewhere; AREDS age  related eye disease study; ARMD age related macular degeneration; POAG primary open angle glaucoma; EBMD epithelial/anterior basement membrane dystrophy; ACIOL anterior chamber intraocular lens; IOL intraocular lens; PCIOL posterior chamber intraocular lens; Phaco/IOL phacoemulsification with intraocular lens placement; [place] photorefractive keratectomy; LASIK laser assisted in situ keratomileusis; HTN hypertension; DM diabetes mellitus; COPD chronic obstructive pulmonary disease

## 2021-02-26 ENCOUNTER — Other Ambulatory Visit: Payer: Self-pay

## 2021-02-26 ENCOUNTER — Ambulatory Visit (INDEPENDENT_AMBULATORY_CARE_PROVIDER_SITE_OTHER): Payer: BC Managed Care – PPO | Admitting: Ophthalmology

## 2021-02-26 ENCOUNTER — Encounter (INDEPENDENT_AMBULATORY_CARE_PROVIDER_SITE_OTHER): Payer: Self-pay | Admitting: Ophthalmology

## 2021-02-26 DIAGNOSIS — H25813 Combined forms of age-related cataract, bilateral: Secondary | ICD-10-CM

## 2021-02-26 DIAGNOSIS — D3132 Benign neoplasm of left choroid: Secondary | ICD-10-CM

## 2021-02-26 DIAGNOSIS — H53001 Unspecified amblyopia, right eye: Secondary | ICD-10-CM

## 2021-02-26 DIAGNOSIS — H3581 Retinal edema: Secondary | ICD-10-CM | POA: Diagnosis not present

## 2022-02-18 NOTE — Progress Notes (Shared)
[facility] Note  02/26/2022     CHIEF COMPLAINT Patient presents for No chief complaint on file.    HISTORY OF PRESENT ILLNESS: Kevin Santana is a 56 y.o. male who presents to the clinic today for:     pt states vision is stable  Referring physician: Lynnell Jude, MD [place],  [place] [id number]  HISTORICAL INFORMATION:   Selected notes from the MEDICAL RECORD NUMBER Referred by Dr. Rick Duff for ret eval   CURRENT MEDICATIONS: No current outpatient medications on file. (Ophthalmic Drugs)   No current facility-administered medications for this visit. (Ophthalmic Drugs)   Current Outpatient Medications (Other)  Medication Sig   co-enzyme Q-10 50 MG capsule Take 50 mg by mouth daily.   Flaxseed, Linseed, (FLAX SEED OIL PO) Take by mouth.   No current facility-administered medications for this visit. (Other)      REVIEW OF SYSTEMS:      ALLERGIES No Known Allergies  PAST MEDICAL HISTORY Past Medical History:  Diagnosis Date   Cataract    Sciatica    Snores    Past Surgical History:  Procedure Laterality Date   COLONOSCOPY WITH PROPOFOL N/A 02/02/2018   Procedure: COLONOSCOPY WITH PROPOFOL;  Surgeon: Lucilla Lame, MD;  Location: [facility];  Service: Endoscopy;  Laterality: N/A;   POLYPECTOMY N/A 02/02/2018   Procedure: POLYPECTOMY;  Surgeon: Lucilla Lame, MD;  Location: [facility];  Service: Endoscopy;  Laterality: N/A;    FAMILY HISTORY No family history on file.  SOCIAL HISTORY Social History   Tobacco Use   Smoking status: Never   Smokeless tobacco: Never  Substance Use Topics   Alcohol use: No         OPHTHALMIC EXAM:  Not recorded     IMAGING AND PROCEDURES  Imaging and Procedures for @TODAY @             ASSESSMENT/PLAN:    ICD-10-CM   1. 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 Combined forms of age-related cataract of both eyes  H25.813     3.  Amblyopia of right eye  H53.001        1. Choroidal Nevus OS  - pigmented choroidal mass spanning 6-7 oclock equator, no SRF or orange pigment  - OCT shows thickness ~[phone] microns; no SRF, but there is surrounding retinoschisis -- no change from prior  - b-scan shows mild elevation of inferior choroidal lesion  - baseline Optos pictures obtained 02.23.21  - saw Dr. Blanchie Serve on 3.5.21 who agreed with diagnosis and recommended annual f/u here, return to [facility] if any changes  - f/u here in 1 year, DFE, OCT, optos colors   2. Mixed form age related cataracts OU  - The symptoms of cataract, surgical options, and treatments and risks were discussed with patient.  - discussed diagnosis and progression  - not yet visually significant  - monitor for now  3. Amblyopia OD   Ophthalmic Meds Ordered this visit:  No orders of the defined types were placed in this encounter.       No follow-ups on file.  There are no Patient Instructions on file for this visit.   Explained the diagnoses, plan, and follow up with the patient and they expressed understanding.  Patient expressed understanding of the importance of proper follow up care.   This document serves as a record of services personally performed by Gardiner Sleeper,  MD, PhD. It was created on their behalf by Leonie Douglas, an ophthalmic technician. The creation of this record is the provider's dictation and/or activities during the visit.    Electronically signed by: Leonie Douglas COA, 02/18/22  8:04 AM    Gardiner Sleeper, M.D., Ph.D. Diseases & Surgery of the Retina and Vitreous [facility]: M myopia (nearsighted); A astigmatism; H hyperopia (farsighted); P presbyopia; Mrx spectacle prescription;  CTL contact lenses; OD right eye; OS left eye; OU both eyes  XT exotropia; ET esotropia; PEK punctate epithelial keratitis; PEE punctate epithelial erosions; DES dry eye syndrome; MGD  meibomian gland dysfunction; ATs artificial tears; PFAT's preservative free artificial tears; [place] nuclear sclerotic cataract; PSC posterior subcapsular cataract; ERM epi-retinal membrane; PVD posterior vitreous detachment; RD retinal detachment; DM diabetes mellitus; DR diabetic retinopathy; NPDR non-proliferative diabetic retinopathy; PDR proliferative diabetic retinopathy; CSME clinically significant macular edema; DME diabetic macular edema; dbh dot blot hemorrhages; CWS cotton wool spot; POAG primary open angle glaucoma; C/D cup-to-disc ratio; HVF humphrey visual field; GVF goldmann visual field; OCT optical coherence tomography; IOP intraocular pressure; BRVO Branch retinal vein occlusion; CRVO central retinal vein occlusion; CRAO central retinal artery occlusion; BRAO branch retinal artery occlusion; RT retinal tear; SB scleral buckle; PPV pars plana vitrectomy; VH Vitreous hemorrhage; PRP panretinal laser photocoagulation; IVK intravitreal kenalog; VMT vitreomacular traction; MH Macular hole;  NVD neovascularization of the disc; NVE neovascularization elsewhere; AREDS age related eye disease study; ARMD age related macular degeneration; POAG primary open angle glaucoma; EBMD epithelial/anterior basement membrane dystrophy; ACIOL anterior chamber intraocular lens; IOL intraocular lens; PCIOL posterior chamber intraocular lens; Phaco/IOL phacoemulsification with intraocular lens placement; [place] photorefractive keratectomy; LASIK laser assisted in situ keratomileusis; HTN hypertension; DM diabetes mellitus; COPD chronic obstructive pulmonary disease

## 2022-02-26 ENCOUNTER — Ambulatory Visit (INDEPENDENT_AMBULATORY_CARE_PROVIDER_SITE_OTHER): Payer: Managed Care, Other (non HMO) | Admitting: Ophthalmology

## 2022-02-26 ENCOUNTER — Encounter (INDEPENDENT_AMBULATORY_CARE_PROVIDER_SITE_OTHER): Payer: Self-pay | Admitting: Ophthalmology

## 2022-02-26 DIAGNOSIS — H53001 Unspecified amblyopia, right eye: Secondary | ICD-10-CM | POA: Diagnosis not present

## 2022-02-26 DIAGNOSIS — H25813 Combined forms of age-related cataract, bilateral: Secondary | ICD-10-CM

## 2022-02-26 DIAGNOSIS — D3132 Benign neoplasm of left choroid: Secondary | ICD-10-CM

## 2022-03-01 ENCOUNTER — Encounter (INDEPENDENT_AMBULATORY_CARE_PROVIDER_SITE_OTHER): Payer: Self-pay | Admitting: Ophthalmology

## 2023-02-28 ENCOUNTER — Encounter (INDEPENDENT_AMBULATORY_CARE_PROVIDER_SITE_OTHER): Payer: Managed Care, Other (non HMO) | Admitting: Ophthalmology

## 2023-02-28 DIAGNOSIS — D3132 Benign neoplasm of left choroid: Secondary | ICD-10-CM

## 2023-02-28 DIAGNOSIS — H53001 Unspecified amblyopia, right eye: Secondary | ICD-10-CM

## 2023-02-28 DIAGNOSIS — H25813 Combined forms of age-related cataract, bilateral: Secondary | ICD-10-CM

## 2023-05-05 LAB — LAB REPORT - SCANNED: EGFR: 101

## 2023-05-23 ENCOUNTER — Telehealth: Payer: Self-pay

## 2023-05-23 ENCOUNTER — Other Ambulatory Visit: Payer: Self-pay

## 2023-05-23 DIAGNOSIS — Z86010 Personal history of colonic polyps: Secondary | ICD-10-CM

## 2023-05-23 MED ORDER — NA SULFATE-K SULFATE-MG SULF 17.5-3.13-1.6 GM/177ML PO SOLN
1.0000 | Freq: Once | ORAL | 0 refills | Status: AC
Start: 2023-05-23 — End: 2023-05-23

## 2023-05-23 NOTE — Telephone Encounter (Signed)
Pt requesting call back to schedule colonoscopy.

## 2023-05-23 NOTE — Telephone Encounter (Signed)
Gastroenterology Pre-Procedure Review  Request Date: 07/11/23 Requesting Physician: Dr. Servando Snare  PATIENT REVIEW QUESTIONS: The patient responded to the following health history questions as indicated:    1. Are you having any GI issues? no 2. Do you have a personal history of Polyps? yes (02/02/2018 performed by Dr. Servando Snare recommended repeat in 5 years) 3. Do you have a family history of Colon Cancer or Polyps? no 4. Diabetes Mellitus? no 5. Joint replacements in the past 12 months?no 6. Major health problems in the past 3 months?no 7. Any artificial heart valves, MVP, or defibrillator?no    MEDICATIONS & ALLERGIES:    Patient reports the following regarding taking any anticoagulation/antiplatelet therapy:   Plavix, Coumadin, Eliquis, Xarelto, Lovenox, Pradaxa, Brilinta, or Effient? no Aspirin? no  Patient confirms/reports the following medications:  Current Outpatient Medications  Medication Sig Dispense Refill   Flaxseed Oil (LINSEED OIL) OIL Take by mouth.     co-enzyme Q-10 50 MG capsule Take 50 mg by mouth daily.     Flaxseed, Linseed, (FLAX SEED OIL PO) Take by mouth.     No current facility-administered medications for this visit.    Patient confirms/reports the following allergies:  No Known Allergies  No orders of the defined types were placed in this encounter.   AUTHORIZATION INFORMATION Primary Insurance: 1D#: Group #:  Secondary Insurance: 1D#: Group #:  SCHEDULE INFORMATION: Date: 07/11/23 Time: Location: [facility]

## 2023-06-30 ENCOUNTER — Encounter: Payer: Self-pay | Admitting: Gastroenterology

## 2023-07-11 ENCOUNTER — Ambulatory Visit: Payer: Managed Care, Other (non HMO) | Admitting: Anesthesiology

## 2023-07-11 ENCOUNTER — Other Ambulatory Visit: Payer: Self-pay

## 2023-07-11 ENCOUNTER — Ambulatory Visit
Admission: RE | Admit: 2023-07-11 | Discharge: 2023-07-11 | Disposition: A | Discharge status: Home / Self Care | Payer: Managed Care, Other (non HMO) | Attending: Gastroenterology | Admitting: Gastroenterology

## 2023-07-11 ENCOUNTER — Encounter: Payer: Self-pay | Admitting: Gastroenterology

## 2023-07-11 ENCOUNTER — Encounter
Admission: RE | Disposition: A | Discharge status: Home / Self Care | Payer: Self-pay | Source: Home / Self Care | Attending: Gastroenterology

## 2023-07-11 DIAGNOSIS — K635 Polyp of colon: Secondary | ICD-10-CM | POA: Diagnosis not present

## 2023-07-11 DIAGNOSIS — Z860100 Personal history of colon polyps, unspecified: Secondary | ICD-10-CM

## 2023-07-11 DIAGNOSIS — Z1211 Encounter for screening for malignant neoplasm of colon: Secondary | ICD-10-CM | POA: Insufficient documentation

## 2023-07-11 DIAGNOSIS — K640 First degree hemorrhoids: Secondary | ICD-10-CM | POA: Insufficient documentation

## 2023-07-11 HISTORY — PX: COLONOSCOPY WITH PROPOFOL: SHX5780

## 2023-07-11 HISTORY — DX: Sleep apnea, unspecified: G47.30

## 2023-07-11 SURGERY — COLONOSCOPY WITH PROPOFOL
Anesthesia: General

## 2023-07-11 MED ORDER — PROPOFOL 10 MG/ML IV BOLUS
INTRAVENOUS | Status: DC | PRN
  Administered 2023-07-11: 30 mg via INTRAVENOUS
  Administered 2023-07-11: 40 mg via INTRAVENOUS
  Administered 2023-07-11: 50 mg via INTRAVENOUS
  Administered 2023-07-11: 110 mg via INTRAVENOUS
  Administered 2023-07-11: 40 mg via INTRAVENOUS
  Administered 2023-07-11: 50 mg via INTRAVENOUS
  Administered 2023-07-11: 80 mg via INTRAVENOUS
  Administered 2023-07-11: 40 mg via INTRAVENOUS

## 2023-07-11 MED ORDER — LACTATED RINGERS IV SOLN: INTRAVENOUS | Status: DC

## 2023-07-11 MED ORDER — SODIUM CHLORIDE 0.9% FLUSH: 10.0000 mL | INTRAVENOUS | Status: DC | PRN

## 2023-07-11 MED ORDER — LIDOCAINE HCL (CARDIAC) PF 100 MG/5ML IV SOSY
PREFILLED_SYRINGE | INTRAVENOUS | Status: DC | PRN
  Administered 2023-07-11: 60 mg via INTRAVENOUS

## 2023-07-11 MED ORDER — PROPOFOL 10 MG/ML IV BOLUS
INTRAVENOUS | Status: AC
  Filled 2023-07-11: qty 20

## 2023-07-11 MED ORDER — PROPOFOL 10 MG/ML IV BOLUS
INTRAVENOUS | Status: AC
  Filled 2023-07-11: qty 40

## 2023-07-11 MED ORDER — SODIUM CHLORIDE 0.9 % IV SOLN: INTRAVENOUS | Status: DC

## 2023-07-11 MED ORDER — SODIUM CHLORIDE 0.9% FLUSH
10.0000 mL | INTRAVENOUS | Status: DC | PRN
  Administered 2023-07-11: 10 mL via INTRAVENOUS

## 2023-07-11 MED ORDER — STERILE WATER FOR IRRIGATION IR SOLN: Status: DC | PRN

## 2023-07-11 SURGICAL SUPPLY — 21 items

## 2023-07-11 NOTE — Op Note (Signed)
[facility] Gastroenterology Patient Name: Kevin Santana Procedure Date: 07/11/2023 8:55 AM MRN: [id number] Account #: [id number] Date of Birth: [birth year] Admit Type: Outpatient Age: 57 Room: [facility] OR ROOM 01 Gender: Male Note Status: Finalized Instrument Name: [id number] Procedure:             Colonoscopy Indications:           High risk colon cancer surveillance: Personal history                         of colonic polyps Providers:             Midge Minium MD, MD Referring MD:          Dortha Kern (Referring MD) Medicines:             Propofol per Anesthesia Complications:         No immediate complications. Procedure:             Pre-Anesthesia Assessment:                        - Prior to the procedure, a History and Physical was                         performed, and patient medications and allergies were                         reviewed. The patient's tolerance of previous                         anesthesia was also reviewed. The risks and benefits                         of the procedure and the sedation options and risks                         were discussed with the patient. All questions were                         answered, and informed consent was obtained. Prior                         Anticoagulants: The patient has taken no anticoagulant                         or antiplatelet agents. ASA Grade Assessment: II - A                         patient with mild systemic disease. After reviewing                         the risks and benefits, the patient was deemed in                         satisfactory condition to undergo the procedure.                        After obtaining informed consent, the colonoscope was  passed under direct vision. Throughout the procedure,                         the patient's blood pressure, pulse, and oxygen                         saturations were monitored continuously. The                          Colonoscope was introduced through the anus and                         advanced to the the cecum, identified by appendiceal                         orifice and ileocecal valve. The colonoscopy was                         performed without difficulty. The patient tolerated                         the procedure well. The quality of the bowel                         preparation was excellent. Findings:      The perianal and digital rectal examinations were normal.      A 4 mm polyp was found in the ascending colon. The polyp was sessile.       The polyp was removed with a cold snare. Resection and retrieval were       complete.      Non-bleeding internal hemorrhoids were found during retroflexion. The       hemorrhoids were Grade I (internal hemorrhoids that do not prolapse). Impression:            - One 4 mm polyp in the ascending colon, removed with                         a cold snare. Resected and retrieved.                        - Non-bleeding internal hemorrhoids. Recommendation:        - Discharge patient to home.                        - Resume previous diet.                        - Continue present medications.                        - Await pathology results.                        - Repeat colonoscopy in 7 years for surveillance. Procedure Code(s):     --- Professional ---                        [phone], Colonoscopy, flexible; with removal of  tumor(s), polyp(s), or other lesion(s) by snare                         technique Diagnosis Code(s):     --- Professional ---                        Z86.010, Personal history of colonic polyps                        D12.2, Benign neoplasm of ascending colon CPT copyright 2022 American Medical Association. All rights reserved. The codes documented in this report are preliminary and upon coder review may  be revised to meet current compliance requirements. Midge Minium MD, MD 07/11/2023 9:15:31 AM This report has  been signed electronically. Number of Addenda: 0 Note Initiated On: 07/11/2023 8:55 AM Scope Withdrawal Time: 0 hours 7 minutes 5 seconds  Total Procedure Duration: 0 hours 11 minutes 28 seconds  Estimated Blood Loss:  Estimated blood loss: none.      [facility]

## 2023-07-11 NOTE — Anesthesia Postprocedure Evaluation (Signed)
Anesthesia Post Note  Patient: Kevin Santana  Procedure(s) Performed: COLONOSCOPY WITH PROPOFOL with polypectomy  Patient location during evaluation: PACU Anesthesia Type: General Level of consciousness: awake and alert Pain management: pain level controlled Vital Signs Assessment: post-procedure vital signs reviewed and stable Respiratory status: spontaneous breathing, nonlabored ventilation, respiratory function stable and patient connected to nasal cannula oxygen Cardiovascular status: blood pressure returned to baseline and stable Postop Assessment: no apparent nausea or vomiting Anesthetic complications: no   No notable events documented.   Last Vitals:  Vitals:   07/11/23 0925 07/11/23 0926  BP:    Pulse: 73 71  Resp: 18 17  Temp:    SpO2: 94% 94%    Last Pain:  Vitals:   07/11/23 0925  TempSrc:   PainSc: 0-No pain                 Bevely Palmer Braydon Kullman

## 2023-07-11 NOTE — Anesthesia Preprocedure Evaluation (Addendum)
Anesthesia Evaluation  Patient identified by MRN, date of birth, ID band Patient awake    Reviewed: Allergy & Precautions, H&P , NPO status , Patient's Chart, lab work & pertinent test results  Airway Mallampati: III  TM Distance: >3 FB Neck ROM: Full    Dental no notable dental hx.    Pulmonary neg pulmonary ROS   Pulmonary exam normal breath sounds clear to auscultation       Cardiovascular negative cardio ROS Normal cardiovascular exam Rhythm:Regular Rate:Normal     Neuro/Psych  Neuromuscular disease negative neurological ROS  negative psych ROS   GI/Hepatic negative GI ROS, Neg liver ROS,,,  Endo/Other  negative endocrine ROS    Renal/GU negative Renal ROS  negative genitourinary   Musculoskeletal negative musculoskeletal ROS (+)    Abdominal   Peds negative pediatric ROS (+)  Hematology negative hematology ROS (+)   Anesthesia Other Findings Snores Sleep apnea with CPAP 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  Reproductive/Obstetrics negative OB ROS                              Anesthesia Physical Anesthesia Plan  ASA: 2  Anesthesia Plan: General   Post-op Pain Management:    Induction: Intravenous  PONV Risk Score and Plan:   Airway Management Planned: Natural Airway and Nasal Cannula  Additional Equipment:   Intra-op Plan:   Post-operative Plan:   Informed Consent: I have reviewed the patients History and Physical, chart, labs and discussed the procedure including the risks, benefits and alternatives for the proposed anesthesia with the patient or authorized representative who has indicated his/her understanding and acceptance.     Dental Advisory Given  Plan Discussed with: Anesthesiologist, CRNA and Surgeon  Anesthesia Plan Comments: (Patient consented for risks of anesthesia including but not limited to:  - adverse reactions to  medications - risk of airway placement if required - damage to eyes, teeth, lips or other oral mucosa - nerve damage due to positioning  - sore throat or hoarseness - Damage to heart, brain, nerves, lungs, other parts of body or loss of life  Patient voiced understanding and assent.)         Anesthesia Quick Evaluation

## 2023-07-11 NOTE — H&P (Signed)
Midge Minium, MD [facility] [address], [address] [place], [place] [address] Phone:[phone] Fax : [phone]  Primary Care Physician:  Dortha Kern, MD Primary Gastroenterologist:  Dr. Servando Snare  Pre-Procedure History & Physical: HPI:  Kevin Santana is a 57 y.o. male is here for an colonoscopy.   Past Medical History:  Diagnosis Date   Cataract    Sciatica    Sleep apnea with use of continuous positive airway pressure (CPAP)    Snores     Past Surgical History:  Procedure Laterality Date   COLONOSCOPY WITH PROPOFOL N/A 02/02/2018   Procedure: COLONOSCOPY WITH PROPOFOL;  Surgeon: Midge Minium, MD;  Location: [facility];  Service: Endoscopy;  Laterality: N/A;   POLYPECTOMY N/A 02/02/2018   Procedure: POLYPECTOMY;  Surgeon: Midge Minium, MD;  Location: [facility];  Service: Endoscopy;  Laterality: N/A;    Prior to Admission medications   Medication Sig Start Date End Date Taking? Authorizing Provider  co-enzyme Q-10 50 MG capsule Take 50 mg by mouth daily. Patient not taking: Reported on 06/30/2023    [provider]  Flaxseed Oil (LINSEED OIL) OIL Take by mouth. Patient not taking: Reported on 06/30/2023 11/09/19   [provider]  Flaxseed, Linseed, (FLAX SEED OIL PO) Take by mouth. Patient not taking: Reported on 06/30/2023    [provider]    Allergies as of 05/23/2023   (No Known Allergies)    History reviewed. No pertinent family history.  Social History   Socioeconomic History   Marital status: Married    Spouse name: Not on file   Number of children: Not on file   Years of education: Not on file   Highest education level: Not on file  Occupational History   Not on file  Tobacco Use   Smoking status: Never   Smokeless tobacco: Never  Substance and Sexual Activity   Alcohol use: No   Drug use: Not on file   Sexual activity: Not on file  Other Topics Concern   Not on file  Social History Narrative   Not on  file   Social Determinants of Health   Financial Resource Strain: Not on file  Food Insecurity: Not on file  Transportation Needs: Not on file  Physical Activity: Not on file  Stress: Not on file  Social Connections: Not on file  Intimate Partner Violence: Low Risk  (04/19/2021)   Received from [facility]   Intimate Partner Violence    Insults You: Not on file    Threatens You: Not on file    Screams at You: Not on file    Physically Hurt: Not on file    Intimate Partner Violence Score: Not on file    Review of Systems: See HPI, otherwise negative ROS  Physical Exam: BP (!) 157/85   Pulse 84   Temp 97.7 F (36.5 C) (Temporal)   Resp 18   Ht 6\' 1"  (1.854 m)   Wt 102.1 kg   SpO2 95%   BMI 29.69 kg/m  General:   Alert,  pleasant and cooperative in NAD Head:  Normocephalic and atraumatic. Neck:  Supple; no masses or thyromegaly. Lungs:  Clear throughout to auscultation.    Heart:  Regular rate and rhythm. Abdomen:  Soft, nontender and nondistended. Normal bowel sounds, without guarding, and without rebound.   Neurologic:  Alert and  oriented x4;  grossly normal neurologically.  Impression/Plan: Kevin Santana is here for an colonoscopy to be  performed for a history of adenomatous polyps on 2019   Risks, benefits, limitations, and alternatives regarding  colonoscopy have been reviewed with the patient.  Questions have been answered.  All parties agreeable.   Midge Minium, MD  07/11/2023, 8:30 AM

## 2023-07-11 NOTE — Transfer of Care (Signed)
Immediate Anesthesia Transfer of Care Note  Patient: Kevin Santana  Procedure(s) Performed: COLONOSCOPY WITH PROPOFOL  Patient Location: PACU  Anesthesia Type: General  Level of Consciousness: awake, alert  and patient cooperative  Airway and Oxygen Therapy: Patient Spontanous Breathing and Patient connected to supplemental oxygen  Post-op Assessment: Post-op Vital signs reviewed, Patient's Cardiovascular Status Stable, Respiratory Function Stable, Patent Airway and No signs of Nausea or vomiting  Post-op Vital Signs: Reviewed and stable  Complications: No notable events documented.

## 2023-07-12 ENCOUNTER — Encounter: Payer: Self-pay | Admitting: Gastroenterology

## 2023-07-12 LAB — SURGICAL PATHOLOGY

## 2024-02-10 ENCOUNTER — Inpatient Hospital Stay: Attending: Oncology | Admitting: Oncology

## 2024-02-10 ENCOUNTER — Encounter: Payer: Self-pay | Admitting: Oncology

## 2024-02-10 ENCOUNTER — Inpatient Hospital Stay

## 2024-02-10 VITALS — BP 129/89 | HR 83 | Temp 98.6°F | Resp 19 | Wt 238.4 lb

## 2024-02-10 DIAGNOSIS — Z79899 Other long term (current) drug therapy: Secondary | ICD-10-CM | POA: Diagnosis not present

## 2024-02-10 DIAGNOSIS — C8304 Small cell B-cell lymphoma, lymph nodes of axilla and upper limb: Secondary | ICD-10-CM | POA: Insufficient documentation

## 2024-02-10 DIAGNOSIS — C884 Extranodal marginal zone b-cell lymphoma of mucosa-associated lymphoid tissue (malt-lymphoma) not having achieved remission: Secondary | ICD-10-CM | POA: Diagnosis not present

## 2024-02-10 LAB — CBC WITH DIFFERENTIAL/PLATELET
Abs Immature Granulocytes: 0.05 10*3/uL (ref 0.00–0.07)
Basophils Absolute: 0.1 10*3/uL (ref 0.0–0.1)
Basophils Relative: 1 %
Eosinophils Absolute: 0.2 10*3/uL (ref 0.0–0.5)
Eosinophils Relative: 2 %
HCT: 45.6 % (ref 39.0–52.0)
Hemoglobin: 16.1 g/dL (ref 13.0–17.0)
Immature Granulocytes: 1 %
Lymphocytes Relative: 29 %
Lymphs Abs: 2 10*3/uL (ref 0.7–4.0)
MCH: 29.7 pg (ref 26.0–34.0)
MCHC: 35.3 g/dL (ref 30.0–36.0)
MCV: 84.1 fL (ref 80.0–100.0)
Monocytes Absolute: 0.5 10*3/uL (ref 0.1–1.0)
Monocytes Relative: 7 %
Neutro Abs: 4.3 10*3/uL (ref 1.7–7.7)
Neutrophils Relative %: 60 %
Platelets: 207 10*3/uL (ref 150–400)
RBC: 5.42 MIL/uL (ref 4.22–5.81)
RDW: 11.7 % (ref 11.5–15.5)
WBC: 7.1 10*3/uL (ref 4.0–10.5)
nRBC: 0 % (ref 0.0–0.2)

## 2024-02-10 LAB — CMP (CANCER CENTER ONLY)
ALT: 33 U/L (ref 0–44)
AST: 27 U/L (ref 15–41)
Albumin: 4.2 g/dL (ref 3.5–5.0)
Alkaline Phosphatase: 99 U/L (ref 38–126)
Anion gap: 8 (ref 5–15)
BUN: 14 mg/dL (ref 6–20)
CO2: 26 mmol/L (ref 22–32)
Calcium: 9 mg/dL (ref 8.9–10.3)
Chloride: 105 mmol/L (ref 98–111)
Creatinine: 1.14 mg/dL (ref 0.61–1.24)
GFR, Estimated: >60 mL/min (ref >60)
Glucose, Bld: 107 mg/dL — ABNORMAL HIGH (ref 70–99)
Potassium: 4 mmol/L (ref 3.5–5.1)
Sodium: 139 mmol/L (ref 135–145)
Total Bilirubin: 0.9 mg/dL (ref 0.0–1.2)
Total Protein: 7.3 g/dL (ref 6.5–8.1)

## 2024-02-10 LAB — LACTATE DEHYDROGENASE: LDH: 132 U/L (ref 98–192)

## 2024-02-10 NOTE — Progress Notes (Signed)
 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  INTERVAL HISTORY: Patient is a 58 year old male is noted to have a lesion on his right upper arm that was not healing with conventional methods.  Subsequent excisional biopsy revealed the above-stated malignancy.  He currently feels well and is asymptomatic.  He denies any fevers, night sweats, or unintentional weight loss.  He has no neurologic complaints.  He has no chest pain, shortness of breath, cough, or hemoptysis.  He denies any nausea, vomiting, constipation, or diarrhea.  He has no urinary complaints.  Patient offers no specific complaints today.  REVIEW OF SYSTEMS:   Review of Systems  Constitutional: Negative.  Negative for fever, malaise/fatigue and weight loss.  Respiratory: Negative.  Negative for cough and hemoptysis.   Cardiovascular: Negative.  Negative for chest pain and leg swelling.  Gastrointestinal: Negative.  Negative for abdominal pain.  Genitourinary: Negative.  Negative for dysuria.  Musculoskeletal: Negative.  Negative for back pain.  Skin: Negative.  Negative for rash.  Neurological: Negative.  Negative for dizziness, seizures, weakness and headaches.  Psychiatric/Behavioral:  The patient is not nervous/anxious.     As per HPI. Otherwise, a complete review of systems is negative.  PAST MEDICAL HISTORY: Past Medical History:  Diagnosis Date   Cataract    Sciatica    Sleep apnea with use of continuous positive airway pressure (CPAP)    Snores     PAST SURGICAL HISTORY: Past Surgical History:  Procedure Laterality Date   COLONOSCOPY WITH PROPOFOL  N/A 02/02/2018   Procedure: COLONOSCOPY WITH PROPOFOL ;  Surgeon: Marnee Sink, MD;  Location: [facility];  Service: Endoscopy;  Laterality: N/A;   COLONOSCOPY WITH PROPOFOL  N/A 07/11/2023   Procedure: COLONOSCOPY WITH PROPOFOL  with polypectomy;  Surgeon: Marnee Sink, MD;  Location: [facility];  Service: Endoscopy;  Laterality: N/A;   POLYPECTOMY N/A 02/02/2018   Procedure: POLYPECTOMY;  Surgeon: Marnee Sink, MD;  Location: [facility];  Service: Endoscopy;  Laterality: N/A;    FAMILY HISTORY: History reviewed. No pertinent family history.  ADVANCED DIRECTIVES (Y/N):  N  HEALTH MAINTENANCE: Social History   Tobacco Use   Smoking status: Never   Smokeless tobacco: Never  Substance Use Topics   Alcohol use: No   Drug use: Not Currently     Colonoscopy:  PAP:  Bone density:  Lipid panel:  No Known Allergies  Current Outpatient Medications  Medication Sig Dispense Refill   co-enzyme Q-10 50 MG capsule Take 50 mg by mouth daily. (Patient not taking: Reported on 06/30/2023)     Flaxseed Oil (LINSEED OIL) OIL Take by mouth. (Patient not taking: Reported on 06/30/2023)     Flaxseed, Linseed, (FLAX SEED OIL PO) Take by mouth. (Patient not taking: Reported on 06/30/2023)     No current facility-administered medications for this visit.    OBJECTIVE: Vitals:   02/10/24 0837  BP: 129/89  Pulse: 83  Resp: 19  Temp: 98.6 F (37 C)  SpO2: 96%     Body mass index is 31.45 kg/m.    ECOG FS:0 - Asymptomatic  General: Well-developed, well-nourished, no acute distress. Eyes: Pink conjunctiva, anicteric sclera. HEENT: Normocephalic, moist mucous membranes. Lungs: No audible wheezing or coughing. Heart: Regular rate and rhythm. Abdomen:  Soft, nontender, no obvious distention. Musculoskeletal: No edema, cyanosis, or clubbing. Neuro: Alert, answering all questions appropriately. Cranial nerves grossly intact. Skin: Well-healed excisional scar on right upper forearm.  Patient also has a lesion on left flank of undetermined  etiology but biopsy may be necessary.   Psych: Normal affect. Lymphatics: No cervical, calvicular, axillary or inguinal LAD.   LAB RESULTS:  Lab Results  Component Value Date   NA 139 02/10/2024   K 4.0 02/10/2024   CL 105 02/10/2024   CO2 26 02/10/2024   GLUCOSE 107 (H) 02/10/2024   BUN 14 02/10/2024   CREATININE 1.14 02/10/2024   CALCIUM 9.0 02/10/2024   PROT 7.3 02/10/2024   ALBUMIN 4.2 02/10/2024   AST 27 02/10/2024   ALT 33 02/10/2024   ALKPHOS 99 02/10/2024   BILITOT 0.9 02/10/2024   GFRNONAA >60 02/10/2024    Lab Results  Component Value Date   WBC 7.1 02/10/2024   NEUTROABS 4.3 02/10/2024   HGB 16.1 02/10/2024   HCT 45.6 02/10/2024   MCV 84.1 02/10/2024   PLT 207 02/10/2024     STUDIES: No results found.  ASSESSMENT: Primary cutaneous marginal B-cell lymphoma.  PLAN:    Primary cutaneous marginal B-cell lymphoma: Pathology reviewed independently.  Appears to be an isolated lesion, but patient noted to have a lesion on his left flank that may require biopsy.  Will get a PET scan to assess for systemic disease.  If negative, patient may require XRT only to his right upper extremity for local control disease.  If there are suspicious lesions on PET scan, will likely need to pursue bone marrow biopsy and systemic treatment.  Patient will have video-assisted telemedicine visit after completion of his PET scan to discuss the results and additional diagnostic planning if necessary.    I spent a total of 60 minutes reviewing chart data, face-to-face evaluation with the patient, counseling and coordination of care as detailed above.   Patient expressed understanding and was in agreement with this plan. He also understands that He can call clinic at any time with any questions, concerns, or complaints.    Cancer Staging  No matching staging information was found for the patient.   Shellie Dials, MD   02/10/2024 9:58 AM

## 2024-02-10 NOTE — Progress Notes (Signed)
Patient has no concerns,

## 2024-02-14 ENCOUNTER — Ambulatory Visit
Admission: RE | Admit: 2024-02-14 | Discharge: 2024-02-14 | Disposition: A | Discharge status: Home / Self Care | Source: Ambulatory Visit | Attending: Oncology | Admitting: Oncology

## 2024-02-14 DIAGNOSIS — C884 Extranodal marginal zone b-cell lymphoma of mucosa-associated lymphoid tissue (malt-lymphoma) not having achieved remission: Secondary | ICD-10-CM | POA: Insufficient documentation

## 2024-02-14 DIAGNOSIS — J3489 Other specified disorders of nose and nasal sinuses: Secondary | ICD-10-CM | POA: Diagnosis not present

## 2024-02-14 DIAGNOSIS — C8590 Non-Hodgkin lymphoma, unspecified, unspecified site: Secondary | ICD-10-CM | POA: Diagnosis not present

## 2024-02-14 DIAGNOSIS — I517 Cardiomegaly: Secondary | ICD-10-CM | POA: Diagnosis not present

## 2024-02-14 DIAGNOSIS — I700 Atherosclerosis of aorta: Secondary | ICD-10-CM | POA: Diagnosis not present

## 2024-02-14 LAB — COMP PANEL: LEUKEMIA/LYMPHOMA

## 2024-02-14 LAB — GLUCOSE, CAPILLARY: Glucose-Capillary: 91 mg/dL (ref 70–99)

## 2024-02-14 MED ORDER — FLUDEOXYGLUCOSE F - 18 (FDG) INJECTION
12.6300 | Freq: Once | INTRAVENOUS | Status: AC | PRN
  Administered 2024-02-14: 12.63 via INTRAVENOUS

## 2024-02-16 ENCOUNTER — Other Ambulatory Visit

## 2024-02-23 ENCOUNTER — Encounter: Payer: Self-pay | Admitting: *Deleted

## 2024-02-23 ENCOUNTER — Inpatient Hospital Stay: Admitting: Oncology

## 2024-02-23 ENCOUNTER — Encounter: Payer: Self-pay | Admitting: Oncology

## 2024-02-23 DIAGNOSIS — C884 Extranodal marginal zone b-cell lymphoma of mucosa-associated lymphoid tissue (malt-lymphoma) not having achieved remission: Secondary | ICD-10-CM

## 2024-02-23 NOTE — Patient Instructions (Signed)
Slide request faxed to [facility]

## 2024-02-23 NOTE — Progress Notes (Unsigned)
 Patient states he has no new or acute concerns today.

## 2024-02-23 NOTE — Progress Notes (Signed)
 [facility]  Telephone:(336) [phone] [phone]  ID: Alm NOVAK Ingram OB: Mar 26, 1966  MR#: [id number]  RDW#:[id number]  Patient Care Team: Derick Leita POUR, MD as PCP - General (Family Medicine) Jacobo Evalene PARAS, MD as Consulting Physician (Oncology)  I connected with Alm NOVAK Puchalski on 02/24/24 at  3:30 PM EDT by video enabled telemedicine visit and verified that I am speaking with the correct person using two identifiers.   I discussed the limitations, risks, security and privacy concerns of performing an evaluation and management service by telemedicine and the availability of in-person appointments. I also discussed with the patient that there may be a patient responsible charge related to this service. The patient expressed understanding and agreed to proceed.   Other persons participating in the visit and their role in the encounter: Patient, MD.  Patient's location: Home. Provider's location: Clinic.  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  INTERVAL HISTORY: Patient agreed to have a video-assisted telemedicine visit for further evaluation and discussion of the PET scan results.  He currently feels well and is asymptomatic.  He does not complain of any weakness or fatigue.  He denies any fevers, night sweats, or unintentional weight loss.  He has no neurologic complaints.  He has no chest pain, shortness of breath, cough, or hemoptysis.  He denies any nausea, vomiting, constipation, or diarrhea.  He has no urinary complaints.  Patient offers no specific complaints today.  REVIEW OF SYSTEMS:   Review of Systems  Constitutional: Negative.  Negative for fever, malaise/fatigue and weight loss.  Respiratory: Negative.  Negative for cough and hemoptysis.   Cardiovascular: Negative.  Negative for chest pain and leg swelling.  Gastrointestinal: Negative.  Negative for abdominal pain.  Genitourinary: Negative.  Negative for dysuria.   Musculoskeletal: Negative.  Negative for back pain.  Skin: Negative.  Negative for rash.  Neurological: Negative.  Negative for dizziness, seizures, weakness and headaches.  Psychiatric/Behavioral:  The patient is not nervous/anxious.     As per HPI. Otherwise, a complete review of systems is negative.  PAST MEDICAL HISTORY: Past Medical History:  Diagnosis Date   Cataract    Sciatica    Sleep apnea with use of continuous positive airway pressure (CPAP)    Snores     PAST SURGICAL HISTORY: Past Surgical History:  Procedure Laterality Date   COLONOSCOPY WITH PROPOFOL  N/A 02/02/2018   Procedure: COLONOSCOPY WITH PROPOFOL ;  Surgeon: Jinny Carmine, MD;  Location: [facility];  Service: Endoscopy;  Laterality: N/A;   COLONOSCOPY WITH PROPOFOL  N/A 07/11/2023   Procedure: COLONOSCOPY WITH PROPOFOL  with polypectomy;  Surgeon: Jinny Carmine, MD;  Location: [facility];  Service: Endoscopy;  Laterality: N/A;   POLYPECTOMY N/A 02/02/2018   Procedure: POLYPECTOMY;  Surgeon: Jinny Carmine, MD;  Location: [facility];  Service: Endoscopy;  Laterality: N/A;    FAMILY HISTORY: History reviewed. No pertinent family history.  ADVANCED DIRECTIVES (Y/N):  N  HEALTH MAINTENANCE: Social History   Tobacco Use   Smoking status: Never   Smokeless tobacco: Never  Substance Use Topics   Alcohol use: No   Drug use: Not Currently     Colonoscopy:  PAP:  Bone density:  Lipid panel:  No Known Allergies  Current Outpatient Medications  Medication Sig Dispense Refill   co-enzyme Q-10 50 MG capsule Take 50 mg by mouth daily. (Patient not taking: Reported on 06/30/2023)     Flaxseed Oil (LINSEED OIL) OIL Take by mouth. (Patient not taking: Reported on  06/30/2023)     Flaxseed, Linseed, (FLAX SEED OIL PO) Take by mouth. (Patient not taking: Reported on 06/30/2023)     No current facility-administered medications for this visit.    OBJECTIVE: There were no vitals filed for  this visit.    There is no height or weight on file to calculate BMI.    ECOG FS:0 - Asymptomatic  General: Well-developed, well-nourished, no acute distress. HEENT: Normocephalic. Neuro: Alert, answering all questions appropriately. Cranial nerves grossly intact. Psych: Normal affect.  LAB RESULTS:  Lab Results  Component Value Date   NA 139 02/10/2024   K 4.0 02/10/2024   CL 105 02/10/2024   CO2 26 02/10/2024   GLUCOSE 107 (H) 02/10/2024   BUN 14 02/10/2024   CREATININE 1.14 02/10/2024   CALCIUM 9.0 02/10/2024   PROT 7.3 02/10/2024   ALBUMIN 4.2 02/10/2024   AST 27 02/10/2024   ALT 33 02/10/2024   ALKPHOS 99 02/10/2024   BILITOT 0.9 02/10/2024   GFRNONAA >60 02/10/2024    Lab Results  Component Value Date   WBC 7.1 02/10/2024   NEUTROABS 4.3 02/10/2024   HGB 16.1 02/10/2024   HCT 45.6 02/10/2024   MCV 84.1 02/10/2024   PLT 207 02/10/2024     STUDIES: NM PET Image Initial (PI) Whole Body Result Date: 02/14/2024 CLINICAL DATA:  Initial treatment strategy for cutaneous lymphoma. Right upper arm biopsy 3 weeks prior to imaging. EXAM: NUCLEAR MEDICINE PET WHOLE BODY TECHNIQUE: 12.6 mCi F-18 FDG was injected intravenously. Full-ring PET imaging was performed from the head to foot after the radiotracer. CT data was obtained and used for attenuation correction and anatomic localization. Fasting blood glucose: 91 mg/dl COMPARISON:  None Available. FINDINGS: Mediastinal blood pool activity: SUV max 2.9 Hepatic activity: SUV max 3.8 HEAD/NECK: Small faintly metabolic nodules in the left parotid gland including a 5 mm in short axis oval-shaped nodule on image 39 series 6 with maximum SUV 2.4 compared to background parotid activity at 1.4. These are probably small intraparotid lymph nodes and are below mediastinal blood pool activity. Bilaterally symmetric tonsillar activity with maximum SUV 6.6 on the right and 6.1 on the left, probably physiologic. No CT correlate. A right level Ia  lymph node measuring 0.7 cm in short axis on image 54 series 6 has maximum SUV of 2.6, Deauville 2. Incidental CT findings: Minimal polypoid mucoperiosteal thickening in the right maxillary sinus. CHEST: No significant abnormal hypermetabolic activity in this region. Incidental CT findings: Mild cardiomegaly. ABDOMEN/PELVIS: No significant abnormal hypermetabolic activity in this region. Incidental CT findings: Dependent density in the gallbladder probably from sludge. Mild abdominal aortic atherosclerosis. SKELETON: 2.6 cm lucent lesion in the left hip intertrochanteric region has a maximum SUV of 2.6. Morphologic characteristics favoring chondroma but are not entirely specific. This could be further characterized with MRI of the left hip with and without contrast. No cortical destruction identified. Incidental CT findings: none EXTREMITIES: No significant abnormal hypermetabolic activity in this region. Incidental CT findings: none IMPRESSION: 1. No findings of active lymphoma. 2. Small faintly metabolic nodules in the left parotid gland are probably small intraparotid lymph nodes and are below mediastinal blood pool activity. 3. Bilaterally symmetric tonsillar activity is probably physiologic. 4. A 0.7 cm in short axis right level Ia lymph node has maximum SUV of 2.6, Deauville 2, not pathologically enlarged. 5. 2.6 cm lucent lesion in the left hip intertrochanteric region has a maximum SUV of 2.6. Morphologic characteristics favoring chondroma but are not entirely specific. This  could be further characterized with MRI of the left hip with and without contrast. 6. Mild cardiomegaly. 7. Dependent density in the gallbladder probably from sludge. 8. Minimal polypoid mucoperiosteal thickening in the right maxillary sinus. 9.  Aortic Atherosclerosis (ICD10-I70.0). Electronically Signed   By: Ryan Salvage M.D.   On: 02/14/2024 13:57    ASSESSMENT: Primary cutaneous marginal B-cell lymphoma.  PLAN:     Primary cutaneous marginal B-cell lymphoma: Pathology reviewed independently.  Appears to be an isolated lesion, but patient noted to have a lesion on his left flank that will require biopsy.  PET scan results from February 14, 2024 reviewed independently and reported as above with no obvious evidence of systemic disease.  No intervention is needed at this time.  Patient does not require bone marrow biopsy.  Have requested pathology slides to ensure clear margins.  If margins are clear, no further follow-up is necessary.  If patient has positive or close margins, he may require referral to radiation oncology for local control of disease. No follow-up has been scheduled at this time.  Will call patient if radiation oncology consultation is necessary.  Please refer patient back if there are any questions or concerns.    I provided 20 minutes of face-to-face video visit time during this encounter which included chart review, counseling, and coordination of care as documented above.    Patient expressed understanding and was in agreement with this plan. He also understands that He can call clinic at any time with any questions, concerns, or complaints.    Cancer Staging  No matching staging information was found for the patient.   Evalene JINNY Reusing, MD   02/23/2024 2:43 PM

## 2024-03-13 ENCOUNTER — Other Ambulatory Visit: Payer: Self-pay | Admitting: Oncology

## 2024-03-13 ENCOUNTER — Other Ambulatory Visit: Payer: Self-pay | Admitting: *Deleted

## 2024-03-13 DIAGNOSIS — C884 Extranodal marginal zone b-cell lymphoma of mucosa-associated lymphoid tissue (malt-lymphoma) not having achieved remission: Secondary | ICD-10-CM

## 2024-03-26 LAB — SURGICAL PATHOLOGY
